# Patient Record
Sex: Male | Born: 1980 | Race: Black or African American | Hispanic: No | Marital: Married | Smoking: Never smoker
Health system: Southern US, Community
[De-identification: ages and names within clinical notes are randomized; demographics above are authoritative.]

## PROBLEM LIST (undated history)

## (undated) DIAGNOSIS — E109 Type 1 diabetes mellitus without complications: Secondary | ICD-10-CM

---

## 2010-08-02 ENCOUNTER — Encounter: Attending: Family Medicine | Admitting: Dietician

## 2010-08-02 DIAGNOSIS — Z713 Dietary counseling and surveillance: Secondary | ICD-10-CM | POA: Insufficient documentation

## 2010-08-02 DIAGNOSIS — E109 Type 1 diabetes mellitus without complications: Secondary | ICD-10-CM | POA: Insufficient documentation

## 2011-05-26 ENCOUNTER — Encounter: Payer: Self-pay | Admitting: Emergency Medicine

## 2011-05-26 ENCOUNTER — Inpatient Hospital Stay (HOSPITAL_COMMUNITY)
Admission: EM | Admit: 2011-05-26 | Discharge: 2011-05-30 | DRG: 639 | Disposition: A | Discharge status: Home / Self Care | Source: Ambulatory Visit | Attending: Internal Medicine | Admitting: Internal Medicine

## 2011-05-26 DIAGNOSIS — E109 Type 1 diabetes mellitus without complications: Secondary | ICD-10-CM

## 2011-05-26 DIAGNOSIS — E1010 Type 1 diabetes mellitus with ketoacidosis without coma: Principal | ICD-10-CM | POA: Diagnosis present

## 2011-05-26 DIAGNOSIS — Z23 Encounter for immunization: Secondary | ICD-10-CM

## 2011-05-26 DIAGNOSIS — R197 Diarrhea, unspecified: Secondary | ICD-10-CM | POA: Diagnosis present

## 2011-05-26 DIAGNOSIS — Z794 Long term (current) use of insulin: Secondary | ICD-10-CM

## 2011-05-26 DIAGNOSIS — E1110 Type 2 diabetes mellitus with ketoacidosis without coma: Secondary | ICD-10-CM

## 2011-05-26 DIAGNOSIS — E860 Dehydration: Secondary | ICD-10-CM | POA: Diagnosis present

## 2011-05-26 DIAGNOSIS — R112 Nausea with vomiting, unspecified: Secondary | ICD-10-CM | POA: Diagnosis present

## 2011-05-26 HISTORY — DX: Type 1 diabetes mellitus without complications: E10.9

## 2011-05-26 LAB — GLUCOSE, CAPILLARY: Glucose-Capillary: 573 mg/dL (ref 70–99)

## 2011-05-26 MED ORDER — SODIUM CHLORIDE 0.9 % IV BOLUS (SEPSIS)
1000.0000 mL | Freq: Once | INTRAVENOUS | Status: AC
  Administered 2011-05-26: 1000 mL via INTRAVENOUS

## 2011-05-26 MED ORDER — ONDANSETRON HCL 4 MG/2ML IJ SOLN
4.0000 mg | Freq: Once | INTRAMUSCULAR | Status: AC
  Administered 2011-05-26: 4 mg via INTRAVENOUS
  Filled 2011-05-26: qty 2

## 2011-05-26 NOTE — ED Notes (Signed)
Pt reports n/v/d onset this AM  3-5 emesis and 4-5 lose stools today pt reports Hx DM

## 2011-05-26 NOTE — ED Notes (Addendum)
Pt stood during orthostatics. He became diaphoretic and had to sit back down in the bed. Pt now resting. MD made aware.

## 2011-05-26 NOTE — ED Notes (Signed)
RT called for blood gas

## 2011-05-26 NOTE — ED Provider Notes (Signed)
History     CSN: [id number]  Arrival date & time 05/26/11  2142   First MD Initiated Contact with Patient 05/26/11 2307      Chief Complaint  Patient presents with  . Nausea  . Emesis  . Weakness  . Diarrhea  . Dehydration    (Consider location/radiation/quality/duration/timing/severity/associated sxs/prior treatment) HPI Comments: Type I diabetic presenting with one day of nausea, vomiting, diarrhea abdominal pain. Symptoms started this morning and consist of 5 episodes of nonbilious nonbloody emesis and 4 episodes of loose nonbloody stools. Denies any fevers. Z. no appetite today and his sugars have been elevated in the 290 range.  Denies Chest pain, shortness of breath, fever, chills, dysuria or hematuria.  Was out of insulin for two days last week but is now back on it. PCP Tamera Punt at [facility] urgent care.  The history is provided by the patient.    Past Medical History  Diagnosis Date  . Diabetes mellitus     History reviewed. No pertinent past surgical history.  History reviewed. No pertinent family history.  History  Substance Use Topics  . Smoking status: Never Smoker   . Smokeless tobacco: Not on file  . Alcohol Use: No      Review of Systems  Gastrointestinal: Positive for vomiting and diarrhea.  Neurological: Positive for weakness.  All other systems reviewed and are negative.    Allergies  Review of patient's allergies indicates no known allergies.  Home Medications   Current Outpatient Rx  Name Route Sig Dispense Refill  . INSULIN ASPART 100 UNIT/ML [place] SOLN Subcutaneous Inject 2-10 Units into the skin 3 (three) times daily before meals.      . INSULIN GLARGINE 100 UNIT/ML [place] SOLN Subcutaneous Inject 30 Units into the skin 2 (two) times daily.        BP 119/70  Pulse 117  Temp(Src) 98.5 F (36.9 C) (Oral)  Resp 20  SpO2 100%  Physical Exam  Constitutional: He is oriented to person, place, and time. He appears well-developed and  well-nourished. No distress.  HENT:  Head: Normocephalic and atraumatic.  Mouth/Throat: No oropharyngeal exudate.       Dry mucous membranes  Eyes: Conjunctivae and EOM are normal. Pupils are equal, round, and reactive to light.  Neck: Normal range of motion. Neck supple.  Cardiovascular: Normal rate, regular rhythm and normal heart sounds.   Pulmonary/Chest: Effort normal. No respiratory distress.  Abdominal: Soft. Bowel sounds are normal. There is no tenderness. There is no rebound and no guarding.  Musculoskeletal: Normal range of motion. He exhibits no edema and no tenderness.  Neurological: He is alert and oriented to person, place, and time. No cranial nerve deficit.  Skin: Skin is warm.    ED Course  Procedures (including critical care time)  Labs Reviewed  CBC - Abnormal; Notable for the following:    WBC 32.2 (*)    All other components within normal limits  DIFFERENTIAL - Abnormal; Notable for the following:    Neutrophils Relative 92 (*)    Neutro Abs 29.6 (*)    Lymphocytes Relative 4 (*)    Monocytes Absolute 1.3 (*)    All other components within normal limits  BASIC METABOLIC PANEL - Abnormal; Notable for the following:    Chloride 89 (*)    CO2 13 (*)    Glucose, Bld 507 (*)    GFR calc non Af Amer 87 (*)    All other components within normal limits  URINALYSIS, ROUTINE W REFLEX MICROSCOPIC - Abnormal; Notable for the following:    Glucose, UA >1000 (*)    Ketones, ur >80 (*)    All other components within normal limits  LACTIC ACID, PLASMA - Abnormal; Notable for the following:    Lactic Acid, Venous 5.9 (*)    All other components within normal limits  GLUCOSE, CAPILLARY - Abnormal; Notable for the following:    Glucose-Capillary 573 (*)    All other components within normal limits  POCT I-STAT 3, BLOOD GAS (G3P V) - Abnormal; Notable for the following:    pH, Ven 7.170 (*)    pCO2, Ven 37.6 (*)    Bicarbonate 13.7 (*)    Acid-base deficit 14.0 (*)      All other components within normal limits  GLUCOSE, CAPILLARY - Abnormal; Notable for the following:    Glucose-Capillary 475 (*)    All other components within normal limits  URINE MICROSCOPIC-ADD ON  BLOOD GAS, VENOUS  POCT CBG MONITORING  KETONES, QUALITATIVE   No results found.   1. DKA (diabetic ketoacidoses)       MDM  Nausea, diarrhea, abdominal pain and vomiting with elevated blood sugar. Rule out DKA. Patient Clinically dehydrated, will initiate IV hydration, antiemetics, obtain labs.  PH 7.1 with HCO3 13.  Concern for DKA on VBG.  Will start IV insulin glucostabilizer with IVF.  Anion gap 23, lactate 6, bicarbonate 13. DKA likely from noncompliance with insulin.  No infectious symptoms.  CRITICAL CARE Performed by: Glynn Octave   Total critical care time: 35  Critical care time was exclusive of separately billable procedures and treating other patients.  Critical care was necessary to treat or prevent imminent or life-threatening deterioration.  Critical care was time spent personally by me on the following activities: development of treatment plan with patient and/or surrogate as well as nursing, discussions with consultants, evaluation of patient's response to treatment, examination of patient, obtaining history from patient or surrogate, ordering and performing treatments and interventions, ordering and review of laboratory studies, ordering and review of radiographic studies, pulse oximetry and re-evaluation of patient's condition.       Glynn Octave, MD 05/27/11 [phone]

## 2011-05-27 ENCOUNTER — Encounter (HOSPITAL_COMMUNITY): Payer: Self-pay | Admitting: Internal Medicine

## 2011-05-27 ENCOUNTER — Emergency Department (HOSPITAL_COMMUNITY)

## 2011-05-27 DIAGNOSIS — E1010 Type 1 diabetes mellitus with ketoacidosis without coma: Secondary | ICD-10-CM | POA: Diagnosis present

## 2011-05-27 DIAGNOSIS — E109 Type 1 diabetes mellitus without complications: Secondary | ICD-10-CM | POA: Diagnosis present

## 2011-05-27 LAB — CBC
HCT: 46 % (ref 39.0–52.0)
Hemoglobin: 16.2 g/dL (ref 13.0–17.0)
Hemoglobin: 12.7 g/dL — ABNORMAL LOW (ref 13.0–17.0)
MCHC: 34.1 g/dL (ref 30.0–36.0)
MCV: 80.7 fL (ref 78.0–100.0)
Platelets: 158 10*3/uL (ref 150–400)
RBC: 5.7 MIL/uL (ref 4.22–5.81)
RBC: 4.58 MIL/uL (ref 4.22–5.81)
WBC: 32.2 10*3/uL — ABNORMAL HIGH (ref 4.0–10.5)

## 2011-05-27 LAB — BASIC METABOLIC PANEL
BUN: 20 mg/dL (ref 6–23)
BUN: 17 mg/dL (ref 6–23)
BUN: 15 mg/dL (ref 6–23)
CO2: 13 mEq/L — ABNORMAL LOW (ref 19–32)
Calcium: 7.8 mg/dL — ABNORMAL LOW (ref 8.4–10.5)
Calcium: 8.2 mg/dL — ABNORMAL LOW (ref 8.4–10.5)
Calcium: 8.8 mg/dL (ref 8.4–10.5)
Calcium: 8.5 mg/dL (ref 8.4–10.5)
Chloride: 89 mEq/L — ABNORMAL LOW (ref 96–112)
Creatinine, Ser: 1.11 mg/dL (ref 0.50–1.35)
Creatinine, Ser: 0.95 mg/dL (ref 0.50–1.35)
Creatinine, Ser: 0.92 mg/dL (ref 0.50–1.35)
GFR calc Af Amer: >90 mL/min (ref >90)
GFR calc Af Amer: >90 mL/min (ref >90)
GFR calc Af Amer: >90 mL/min (ref >90)
GFR calc non Af Amer: >90 mL/min (ref >90)
GFR calc non Af Amer: >90 mL/min (ref >90)
GFR calc non Af Amer: >90 mL/min (ref >90)
Glucose, Bld: 507 mg/dL — ABNORMAL HIGH (ref 70–99)
Potassium: 4.2 mEq/L (ref 3.5–5.1)
Sodium: 142 mEq/L (ref 135–145)
Sodium: 140 mEq/L (ref 135–145)

## 2011-05-27 LAB — GLUCOSE, CAPILLARY
Glucose-Capillary: 136 mg/dL — ABNORMAL HIGH (ref 70–99)
Glucose-Capillary: 141 mg/dL — ABNORMAL HIGH (ref 70–99)
Glucose-Capillary: 145 mg/dL — ABNORMAL HIGH (ref 70–99)
Glucose-Capillary: 260 mg/dL — ABNORMAL HIGH (ref 70–99)
Glucose-Capillary: 322 mg/dL — ABNORMAL HIGH (ref 70–99)
Glucose-Capillary: 362 mg/dL — ABNORMAL HIGH (ref 70–99)
Glucose-Capillary: 475 mg/dL — ABNORMAL HIGH (ref 70–99)
Glucose-Capillary: 77 mg/dL (ref 70–99)

## 2011-05-27 LAB — MRSA PCR SCREENING: MRSA by PCR: NEGATIVE

## 2011-05-27 LAB — DIFFERENTIAL
Basophils Absolute: 0 10*3/uL (ref 0.0–0.1)
Eosinophils Relative: 0 % (ref 0–5)
Lymphocytes Relative: 4 % — ABNORMAL LOW (ref 12–46)
Lymphs Abs: 1.3 10*3/uL (ref 0.7–4.0)
Monocytes Absolute: 1.3 10*3/uL — ABNORMAL HIGH (ref 0.1–1.0)
Monocytes Relative: 4 % (ref 3–12)
Neutro Abs: 29.6 10*3/uL — ABNORMAL HIGH (ref 1.7–7.7)

## 2011-05-27 LAB — HEMOGLOBIN A1C
Hgb A1c MFr Bld: 12.2 % — ABNORMAL HIGH (ref <5.7)
Mean Plasma Glucose: 303 mg/dL — ABNORMAL HIGH (ref <117)

## 2011-05-27 LAB — URINALYSIS, ROUTINE W REFLEX MICROSCOPIC
Glucose, UA: >1000 mg/dL — AB
Hgb urine dipstick: NEGATIVE
Ketones, ur: >80 mg/dL — AB
Leukocytes, UA: NEGATIVE
pH: 5 (ref 5.0–8.0)

## 2011-05-27 LAB — POCT I-STAT 3, VENOUS BLOOD GAS (G3P V)
Acid-base deficit: 14 mmol/L — ABNORMAL HIGH (ref 0.0–2.0)
Bicarbonate: 13.7 mEq/L — ABNORMAL LOW (ref 20.0–24.0)
O2 Saturation: 56 %
pO2, Ven: 37 mmHg (ref 30.0–45.0)

## 2011-05-27 LAB — URINE MICROSCOPIC-ADD ON

## 2011-05-27 MED ORDER — SODIUM CHLORIDE 0.45 % IV SOLN
INTRAVENOUS | Status: AC
  Administered 2011-05-27: 14:00:00 via INTRAVENOUS

## 2011-05-27 MED ORDER — SODIUM CHLORIDE 0.9 % IV SOLN: INTRAVENOUS | Status: AC

## 2011-05-27 MED ORDER — SODIUM CHLORIDE 0.9 % IV SOLN
999.0000 mL | Freq: Once | INTRAVENOUS | Status: AC
  Administered 2011-05-27: 999 mL via INTRAVENOUS

## 2011-05-27 MED ORDER — POTASSIUM CHLORIDE 10 MEQ/100ML IV SOLN: 10.0000 meq | INTRAVENOUS | Status: AC

## 2011-05-27 MED ORDER — INSULIN GLARGINE 100 UNIT/ML ~~LOC~~ SOLN
25.0000 [IU] | Freq: Two times a day (BID) | SUBCUTANEOUS | Status: DC
  Administered 2011-05-27 – 2011-05-28 (×3): 25 [IU] via SUBCUTANEOUS
  Filled 2011-05-27: qty 3

## 2011-05-27 MED ORDER — SODIUM CHLORIDE 0.45 % IV SOLN
INTRAVENOUS | Status: DC
  Administered 2011-05-27: 03:00:00 via INTRAVENOUS

## 2011-05-27 MED ORDER — PNEUMOCOCCAL VAC POLYVALENT 25 MCG/0.5ML IJ INJ
0.5000 mL | INJECTION | INTRAMUSCULAR | Status: AC
  Administered 2011-05-28: 0.5 mL via INTRAMUSCULAR
  Filled 2011-05-27: qty 0.5

## 2011-05-27 MED ORDER — INSULIN REGULAR HUMAN 100 UNIT/ML IJ SOLN
INTRAMUSCULAR | Status: AC
  Administered 2011-05-27: 4.2 [IU]/h via INTRAVENOUS
  Filled 2011-05-27: qty 1

## 2011-05-27 MED ORDER — ONDANSETRON HCL 4 MG/2ML IJ SOLN: 4.0000 mg | Freq: Three times a day (TID) | INTRAMUSCULAR | Status: DC | PRN

## 2011-05-27 MED ORDER — DEXTROSE 50 % IV SOLN: 25.0000 mL | INTRAVENOUS | Status: DC | PRN

## 2011-05-27 MED ORDER — HEPARIN SODIUM (PORCINE) 5000 UNIT/ML IJ SOLN
5000.0000 [IU] | Freq: Three times a day (TID) | INTRAMUSCULAR | Status: DC
  Administered 2011-05-27 – 2011-05-29 (×7): 5000 [IU] via SUBCUTANEOUS
  Filled 2011-05-27 (×13): qty 1

## 2011-05-27 MED ORDER — INFLUENZA VIRUS VACC SPLIT PF IM SUSP
0.5000 mL | INTRAMUSCULAR | Status: AC
  Administered 2011-05-27: 0.5 mL via INTRAMUSCULAR
  Filled 2011-05-27: qty 0.5

## 2011-05-27 MED ORDER — DEXTROSE-NACL 5-0.45 % IV SOLN
INTRAVENOUS | Status: AC
  Administered 2011-05-27: 06:00:00 via INTRAVENOUS

## 2011-05-27 MED ORDER — SODIUM CHLORIDE 0.9 % IV SOLN
INTRAVENOUS | Status: AC
  Administered 2011-05-27: 02:00:00 via INTRAVENOUS

## 2011-05-27 MED ORDER — INSULIN ASPART 100 UNIT/ML ~~LOC~~ SOLN
0.0000 [IU] | Freq: Three times a day (TID) | SUBCUTANEOUS | Status: DC
  Administered 2011-05-27: 3 [IU] via SUBCUTANEOUS
  Administered 2011-05-28: 5 [IU] via SUBCUTANEOUS
  Filled 2011-05-27: qty 3

## 2011-05-27 NOTE — H&P (Signed)
PCP:   No primary provider on file.   Pt states Richard Bray at [facility] but unable to enter this into EPIC  Chief Complaint:  Abd pain, nausea, vomiting, diarrhea   HPI: 30yoM with h/o DM type 1 and prior DKA presents with DKA.   Pt states he has been out of insulin for the past couple days. Pt was feeling well, in usual  state of health until yesterday, when he developed abdominal pain, that persisted until the am of  1/7. He went to see his PCP's office to refill his insulin, where they started him on either  Glyburide or Glipizide (wife cannot remember) and stopped Metformin due to some abdominal pain  and got an A1c which was 12. He went home and was not feeling well, tried to eat something but  started vomiting, then having diarrhea. He came to the ED.   In the ED pt was tachycardic to 117, other vitals stable. Labs showed Na 135, K 5.0, Cl 89, HCO3  of 13, renal 20/1.11, glucose 507. VBG showed 7.17 / CO2 37 / O2 37 / HCO3 14. Lactate was 5.9.  WBC 32.2 with 92% neutros, o/w CBC normal. UA with >1000 glucose, >80 ketones, o/w negative. CXR  negative.   Pt is able to relate his insulin regimen of Lantus 30u BID and Novolog before meals, usually less  than 10u with meals. He states he was diagnosed at 31yo with type 1 DM in [place], where he was  admitted to an ICU with blood sugar of 1100.   ROS as above, o/w with 12 lb wt loss in 3 mos, polyuria and thirst, n/v/abd pain/diarrhea. He  denies fevers, chills, sweats, dysuria, cough, chest pain, SOB. ROS o/w negative.   Past Medical History  Diagnosis Date  . Type 1 diabetes mellitus     Diagnosed at 31 yo with DKA / ICU admission    History reviewed. No pertinent past surgical history.  Medications:  HOME MEDS:  Reconciled  Prior to Admission medications   Medication Sig Start Date End Date Taking? Authorizing Provider  insulin aspart (NOVOLOG) 100 UNIT/ML injection Inject 2-10 Units into the skin 3  (three) times daily before meals.     Yes Historical Provider, MD  insulin glargine (LANTUS) 100 UNIT/ML injection Inject 30 Units into the skin 2 (two) times daily.     Yes Historical Provider, MD  lisinopril (PRINIVIL,ZESTRIL) 20 MG tablet Take 20 mg by mouth daily.     Yes Historical Provider, MD   Allergies:  No Known Allergies  Social History:   reports that he has never smoked. He has never used smokeless tobacco. He reports that he uses illicit drugs (Marijuana). He reports that he does not drink alcohol.  Still active, gets around without cane or walker. Lives at home with wife, no children. Unemployed. Never smoker, no alcohol.   Family History: Family History  Problem Relation Age of Onset  . Sickle cell anemia Father   . Aneurysm Mother     Cerebral    Physical Exam: Filed Vitals:   05/26/11 2345 05/26/11 2346 05/27/11 0000 05/27/11 0104  BP: 126/74 119/70 126/70 128/59  Pulse: 106 117 103 109  Temp:      TempSrc:      Resp:   23 19  SpO2: 100% 100% 100% 100%   Blood pressure 128/59, pulse 109, temperature 98.5 F (36.9 C), temperature source Oral, resp. rate 19, SpO2 100.00%. Gen: Thin, overall  healthy but currently fatigued appearing M in stretcher, able to converse and  relate his history, doesn't appear toxic but minimally ill HEENT: PERRL, EOMI, sclera clear and normal. Lips are quite dry appearing, but mouth is fairly  moist without lesions Lungs: CTAB no w/c/r, overall normal exam Heart: Tachycardic without gross m/g, bilateral radials palpable Abd: Soft, NT ND, not rigid, not obese, overall normal Extrem: Thin, but with normal bulk and tone, no BLE edema, feet are minimally cool but not cold Neuro: Alert, conversant, CN 2-12 intact, no slurred speech, no facial droop, moving extremities  well, grossly non-focal   Labs & Imaging Results for orders placed during the hospital encounter of 05/26/11 (from the past 48 hour(s))  CBC     Status: Abnormal    Collection Time   05/26/11 11:24 PM      Component Value Range Comment   WBC 32.2 (*) 4.0 - 10.5 (K/uL)    RBC 5.70  4.22 - 5.81 (MIL/uL)    Hemoglobin 16.2  13.0 - 17.0 (g/dL)    HCT 16.1  09.6 - 04.5 (%)    MCV 80.7  78.0 - 100.0 (fL)    MCH 28.4  26.0 - 34.0 (pg)    MCHC 35.2  30.0 - 36.0 (g/dL)    RDW 40.9  81.1 - 91.4 (%)    Platelets 224  150 - 400 (K/uL)   DIFFERENTIAL     Status: Abnormal   Collection Time   05/26/11 11:24 PM      Component Value Range Comment   Neutrophils Relative 92 (*) 43 - 77 (%)    Neutro Abs 29.6 (*) 1.7 - 7.7 (K/uL)    Lymphocytes Relative 4 (*) 12 - 46 (%)    Lymphs Abs 1.3  0.7 - 4.0 (K/uL)    Monocytes Relative 4  3 - 12 (%)    Monocytes Absolute 1.3 (*) 0.1 - 1.0 (K/uL)    Eosinophils Relative 0  0 - 5 (%)    Eosinophils Absolute 0.0  0.0 - 0.7 (K/uL)    Basophils Relative 0  0 - 1 (%)    Basophils Absolute 0.0  0.0 - 0.1 (K/uL)   BASIC METABOLIC PANEL     Status: Abnormal   Collection Time   05/26/11 11:24 PM      Component Value Range Comment   Sodium 135  135 - 145 (mEq/L)    Potassium 5.0  3.5 - 5.1 (mEq/L)    Chloride 89 (*) 96 - 112 (mEq/L)    CO2 13 (*) 19 - 32 (mEq/L)    Glucose, Bld 507 (*) 70 - 99 (mg/dL)    BUN 20  6 - 23 (mg/dL)    Creatinine, Ser 7.82  0.50 - 1.35 (mg/dL)    Calcium 95.6  8.4 - 10.5 (mg/dL)    GFR calc non Af Amer 87 (*) >90 (mL/min)    GFR calc Af Amer >90  >90 (mL/min)   KETONES, QUALITATIVE     Status: Abnormal   Collection Time   05/26/11 11:24 PM      Component Value Range Comment   Acetone, Bld SMALL (*) NEGATIVE    LACTIC ACID, PLASMA     Status: Abnormal   Collection Time   05/26/11 11:24 PM      Component Value Range Comment   Lactic Acid, Venous 5.9 (*) 0.5 - 2.2 (mmol/L)   GLUCOSE, CAPILLARY     Status: Abnormal   Collection Time  05/26/11 11:31 PM      Component Value Range Comment   Glucose-Capillary 573 (*) 70 - 99 (mg/dL)    Comment 1 Notify RN      Comment 2 Documented in Chart       URINALYSIS, ROUTINE W REFLEX MICROSCOPIC     Status: Abnormal   Collection Time   05/26/11 11:36 PM      Component Value Range Comment   Color, Urine YELLOW  YELLOW     APPearance CLEAR  CLEAR     Specific Gravity, Urine 1.020  1.005 - 1.030     pH 5.0  5.0 - 8.0     Glucose, UA >1000 (*) NEGATIVE (mg/dL)    Hgb urine dipstick NEGATIVE  NEGATIVE     Bilirubin Urine NEGATIVE  NEGATIVE     Ketones, ur >80 (*) NEGATIVE (mg/dL)    Protein, ur NEGATIVE  NEGATIVE (mg/dL)    Urobilinogen, UA 0.2  0.0 - 1.0 (mg/dL)    Nitrite NEGATIVE  NEGATIVE     Leukocytes, UA NEGATIVE  NEGATIVE    URINE MICROSCOPIC-ADD ON     Status: Normal   Collection Time   05/26/11 11:36 PM      Component Value Range Comment   Squamous Epithelial / LPF RARE  RARE     WBC, UA 0-2  <3 (WBC/hpf)    RBC / HPF 0-2  <3 (RBC/hpf)   POCT I-STAT 3, BLOOD GAS (G3P V)     Status: Abnormal   Collection Time   05/27/11 12:03 AM      Component Value Range Comment   pH, Ven 7.170 (*) 7.250 - 7.300     pCO2, Ven 37.6 (*) 45.0 - 50.0 (mmHg)    pO2, Ven 37.0  30.0 - 45.0 (mmHg)    Bicarbonate 13.7 (*) 20.0 - 24.0 (mEq/L)    TCO2 15  0 - 100 (mmol/L)    O2 Saturation 56.0      Acid-base deficit 14.0 (*) 0.0 - 2.0 (mmol/L)    Sample type VENOUS      Comment NOTIFIED PHYSICIAN     GLUCOSE, CAPILLARY     Status: Abnormal   Collection Time   05/27/11 12:20 AM      Component Value Range Comment   Glucose-Capillary 475 (*) 70 - 99 (mg/dL)    Comment 1 Notify RN      Comment 2 Documented in Chart     GLUCOSE, CAPILLARY     Status: Abnormal   Collection Time   05/27/11  2:01 AM      Component Value Range Comment   Glucose-Capillary 362 (*) 70 - 99 (mg/dL)    Comment 1 Notify RN      Dg Chest 2 View  05/27/2011  *RADIOLOGY REPORT*  Clinical Data: Vomiting  CHEST - 2 VIEW  Comparison: None  Findings: Normal mediastinum and heart silhouette.  There is scoliosis of the spine.  No effusion, infiltrate, pneumothorax.  IMPRESSION: No acute  cardiopulmonary process.  Scoliosis.  Original Report Authenticated By: Genevive Bi, M.D.    Impression Present on Admission:  .DKA, type 1, not at goal .Type 1 diabetes mellitus  30yoM with h/o DM type 1 and prior DKA presents with DKA.   DKA: Likely due to missing insulin for the past 1-2 days due to running out of insulin. Despite  high WBC count, no real signs of infection and ROS o/w unremarkable other than uncontrolled DM  type symptoms. States his A1c  done at PCP's was 12, so will defer repeating.   - IVF's, glucose stabilizer, admit to SDU, trending BMET's - Holding Lisinopril for now  - DM education consultation -- pt and wife with many questions and eager to learn   2. Leukocytosis: CXR and UA are negative for infxn. Suspect overall likely due to stress rxn from  DKA. Monitor for now.   SDU, MC team 7 Full code, discussed with pt and wife   Other plans as per orders.  Lorriann Hansmann 05/27/2011, 2:14 AM

## 2011-05-27 NOTE — Progress Notes (Addendum)
Inpatient Diabetes Program Recommendations  AACE/ADA: New Consensus Statement on Inpatient Glycemic Control (2009)  Target Ranges:  Prepandial:   less than 140 mg/dL      Peak postprandial:   less than 180 mg/dL (1-2 hours)      Critically ill patients:  140 - 180 mg/dL   Reason for Visit: Patient admitted with DKA after he ran out of insulin for 1 day.  He see's MD at urgent care for diabetes management.  States Lantus was just increased yesterday due to high A1C from 20 units bid to 30 units bid.  Seems very interested in improving glycemic control and preventing complications.  Briefly discussed physiology of Type 1 diabetes and DKA.  He is interested in seeing endocrinologist for more consistent follow-up.   Inpatient Diabetes Program Recommendations Insulin - Basal: Consider Lantus 30 units once daily when patient transitioned off insulin drip. Insulin - Meal Coverage: Would also benefit from Novolog meal coverage 6 units tid with meals. (to cover CHO intake) HgbA1C: Note last A1C=12.0 indicating very poorly controlled diabetes. Outpatient Referral: Would greatly benefit from outpatient diabetes education.  Note: Patient is interested in learning to [facility] count and cover carbohydrates.  Will follow. Patient states that he does have insurance coverage for his meds.  He currently uses Lantus vials and Novolog pen at home.  He needs follow up with PCP or endocrinologist if possible.

## 2011-05-27 NOTE — ED Notes (Signed)
RN unable to take report at this time. Will call back in 5 minutes.

## 2011-05-27 NOTE — ED Notes (Signed)
MD at bedside. 

## 2011-05-27 NOTE — ED Notes (Signed)
Patient transported to X-ray 

## 2011-05-27 NOTE — Progress Notes (Signed)
Richard Bray CSN:[id number],MRN:[id number] is a 31 y.o. male,  Outpatient Primary MD for the patient is No primary provider on file.  Chief Complaint  Patient presents with  . Nausea  . Emesis  . Weakness  . Diarrhea  . Dehydration        Subjective:   Richard Bray today has, No headache, No chest pain, No abdominal pain - No Nausea, No new weakness tingling or numbness, No Cough - SOB.   Objective:   Filed Vitals:   05/27/11 0220 05/27/11 0300 05/27/11 0800 05/27/11 1200  BP: 122/52 133/61 115/59 95/63  Pulse: 111 113 102   Temp:  99.1 F (37.3 C) 98.9 F (37.2 C) 99.1 F (37.3 C)  TempSrc:  Oral Oral Oral  Resp: 18 17 14 14   Height:  6' (1.829 m)    Weight:  68.6 kg (151 lb 3.8 oz)    SpO2: 100% 100% 100% 100%    Wt Readings from Last 3 Encounters:  05/27/11 68.6 kg (151 lb 3.8 oz)     Intake/Output Summary (Last 24 hours) at 05/27/11 1251 Last data filed at 05/27/11 1200  Gross per 24 hour  Intake    250 ml  Output   1350 ml  Net  -1100 ml    Exam Awake Alert, Oriented *3, No new F.N deficits, Normal affect [place].AT,PERRAL Supple Neck,No JVD, No cervical lymphadenopathy appriciated.  Symmetrical Chest wall movement, Good air movement bilaterally, CTAB RRR,No Gallops,Rubs or new Murmurs, No Parasternal Heave +ve B.Sounds, Abd Soft, Non tender, No organomegaly appriciated, No rebound -guarding or rigidity. No Cyanosis, Clubbing or edema, No new Rash or bruise     Data Review  CBC  Lab 05/27/11 0400 05/26/11 2324  WBC 23.0* 32.2*  HGB 12.7* 16.2  HCT 37.2* 46.0  PLT 158 224  MCV 81.2 80.7  MCH 27.7 28.4  MCHC 34.1 35.2  RDW 12.4 12.4  LYMPHSABS -- 1.3  MONOABS -- 1.3*  EOSABS -- 0.0  BASOSABS -- 0.0  BANDABS -- --    Chemistries   Lab 05/27/11 1035 05/27/11 0833 05/27/11 0600 05/27/11 0400 05/26/11 2324  NA 140 142 140 137 135  K 4.2 4.5 4.2 4.7 5.0  CL 109 110 109 105 89*  CO2 22 22 15* 13* 13*  GLUCOSE 104* 126* 189* 291* 507*  BUN 15 15  16 17 20   CREATININE 0.89 0.95 0.92 0.95 1.11  CALCIUM 8.5 8.8 8.2* 7.8* 10.4  MG -- -- -- -- --  AST -- -- -- -- --  ALT -- -- -- -- --  ALKPHOS -- -- -- -- --  BILITOT -- -- -- -- --   ------------------------------------------------------------------------------------------------------------------ estimated creatinine clearance is 116.7 ml/min (by C-G formula based on Cr of 0.89). ------------------------------------------------------------------------------------------------------------------ No results found for this basename: HGBA1C:2 in the last 72 hours ------------------------------------------------------------------------------------------------------------------ No results found for this basename: CHOL:2,HDL:2,LDLCALC:2,TRIG:2,CHOLHDL:2,LDLDIRECT:2 in the last 72 hours ------------------------------------------------------------------------------------------------------------------ No results found for this basename: TSH,T4TOTAL,FREET3,T3FREE,THYROIDAB in the last 72 hours ------------------------------------------------------------------------------------------------------------------ No results found for this basename: VITAMINB12:2,FOLATE:2,FERRITIN:2,TIBC:2,IRON:2,RETICCTPCT:2 in the last 72 hours  Coagulation profile No results found for this basename: INR:5,PROTIME:5 in the last 168 hours  No results found for this basename: DDIMER:2 in the last 72 hours  Cardiac Enzymes No results found for this basename: CK:3,CKMB:3,TROPONINI:3,MYOGLOBIN:3 in the last 168 hours ------------------------------------------------------------------------------------------------------------------ No components found with this basename: POCBNP:3  Micro Results Recent Results (from the past 240 hour(s))  MRSA PCR SCREENING     Status: Normal   Collection Time  05/27/11  3:25 AM      Component Value Range Status Comment   MRSA by PCR NEGATIVE  NEGATIVE  Final     Radiology Reports Dg  Chest 2 View  05/27/2011  *RADIOLOGY REPORT*  Clinical Data: Vomiting  CHEST - 2 VIEW  Comparison: None  Findings: Normal mediastinum and heart silhouette.  There is scoliosis of the spine.  No effusion, infiltrate, pneumothorax.  IMPRESSION: No acute cardiopulmonary process.  Scoliosis.  Original Report Authenticated By: Genevive Bi, M.D.    Scheduled Meds:   . sodium chloride  999 mL Intravenous Once  . sodium chloride   Intravenous STAT  . heparin  5,000 Units Subcutaneous Q8H  . influenza  inactive virus vaccine  0.5 mL Intramuscular Tomorrow-1000  . insulin glargine  25 Units Subcutaneous BID  . insulin (NOVOLIN-R) infusion   Intravenous To Major  . ondansetron (ZOFRAN) IV  4 mg Intravenous Once  . pneumococcal 23 valent vaccine  0.5 mL Intramuscular Tomorrow-1000  . potassium chloride  10 mEq Intravenous Q1H  . sodium chloride  1,000 mL Intravenous Once   Continuous Infusions:   . sodium chloride    . dextrose 5 % and 0.45% NaCl 75 mL/hr at 05/27/11 0900  . insulin (NOVOLIN-R) infusion    . DISCONTD: sodium chloride 125 mL/hr at 05/27/11 0319   PRN Meds:.dextrose, DISCONTD: ondansetron (ZOFRAN) IV  Assessment & Plan   1. DKA, type 1,- in a Type 1 diabetes mellitus pt - who ran out of his Meds, gap closed, initiate Lantus, off drips in 6 hrs, PO duet now, ISS scale start in 6 hrs, gentle NS bolus as BP low, A1c, Case manager for Med help.  2.Leukocytosis- CXR and UA stable, no temp, likely reactionary.   DVT Prophylaxis Heparin   See all Orders from today for further details     Leroy Sea M.D on 05/27/2011 at 12:51 PM  [facility] Office  [phone]

## 2011-05-27 NOTE — Plan of Care (Signed)
Problem: Consults Goal: Diabetic Ketoacidosis (DKA) Patient Education See Patient Education Modules for education specifics. Outcome: Progressing Pt was out of insulin for 36 hours Goal: Nutrition Consult-if indicated Outcome: Not Progressing Pt npo until DKA resolves

## 2011-05-27 NOTE — Progress Notes (Signed)
Spoke at length to patient and wife regarding diabetes.  Patient has had Type 1 diabetes for 10 years.  Seems motivated to learn.  Gave information re. Carbohydrate counting, etc.  Answered questions re. Sick day rules.  Will follow.

## 2011-05-28 LAB — GLUCOSE, CAPILLARY
Glucose-Capillary: 59 mg/dL — ABNORMAL LOW (ref 70–99)
Glucose-Capillary: 102 mg/dL — ABNORMAL HIGH (ref 70–99)
Glucose-Capillary: 219 mg/dL — ABNORMAL HIGH (ref 70–99)

## 2011-05-28 MED ORDER — INSULIN GLARGINE 100 UNIT/ML ~~LOC~~ SOLN
17.0000 [IU] | Freq: Two times a day (BID) | SUBCUTANEOUS | Status: DC
  Administered 2011-05-29 – 2011-05-30 (×3): 17 [IU] via SUBCUTANEOUS
  Filled 2011-05-28: qty 3

## 2011-05-28 MED ORDER — INSULIN ASPART 100 UNIT/ML ~~LOC~~ SOLN
0.0000 [IU] | Freq: Every day | SUBCUTANEOUS | Status: DC
Start: 2011-05-28 — End: 2011-05-30

## 2011-05-28 MED ORDER — OXYCODONE HCL 5 MG PO TABS
5.0000 mg | ORAL_TABLET | ORAL | Status: DC | PRN
  Administered 2011-05-29 – 2011-05-30 (×2): 10 mg via ORAL
  Filled 2011-05-28 (×2): qty 2

## 2011-05-28 MED ORDER — INSULIN ASPART 100 UNIT/ML ~~LOC~~ SOLN
6.0000 [IU] | Freq: Three times a day (TID) | SUBCUTANEOUS | Status: DC
  Administered 2011-05-29: 6 [IU] via SUBCUTANEOUS

## 2011-05-28 MED ORDER — ONDANSETRON HCL 4 MG/2ML IJ SOLN
4.0000 mg | Freq: Four times a day (QID) | INTRAMUSCULAR | Status: DC | PRN
  Administered 2011-05-28 – 2011-05-29 (×2): 4 mg via INTRAVENOUS
  Filled 2011-05-28 (×2): qty 2

## 2011-05-28 MED ORDER — INSULIN ASPART 100 UNIT/ML ~~LOC~~ SOLN
0.0000 [IU] | Freq: Three times a day (TID) | SUBCUTANEOUS | Status: DC
  Administered 2011-05-29: 3 [IU] via SUBCUTANEOUS

## 2011-05-28 MED ORDER — ACETAMINOPHEN 325 MG PO TABS
650.0000 mg | ORAL_TABLET | Freq: Four times a day (QID) | ORAL | Status: DC | PRN
  Administered 2011-05-28 – 2011-05-29 (×2): 650 mg via ORAL
  Filled 2011-05-28 (×2): qty 2

## 2011-05-28 MED ORDER — INSULIN GLARGINE 100 UNIT/ML ~~LOC~~ SOLN
20.0000 [IU] | Freq: Two times a day (BID) | SUBCUTANEOUS | Status: DC
  Administered 2011-05-28: 17 [IU] via SUBCUTANEOUS

## 2011-05-28 NOTE — Progress Notes (Signed)
05/28/2011 0815 Patients blood sugar was 59. Gave 120cc of OJ. Rechecked blood sugar at 0830 and bs=102. Pt had no sign or symptoms of low blood sugar. Is eating breakfast now. Will continue to monitor. Celesta Gentile

## 2011-05-28 NOTE — Progress Notes (Signed)
05/28/2011 6:01 PM Called report to marissa,rn on 3000. Vss. No complaints of pain. Pt transferred to [id number] with belonging. Celesta Gentile

## 2011-05-28 NOTE — Progress Notes (Signed)
   CARE MANAGEMENT NOTE 05/28/2011  Patient:  Richard Bray, Richard Bray   Account Number:  [id number]  Date Initiated:  05/28/2011  Documentation initiated by:  Onnie Boer  Subjective/Objective Assessment:   PT WAS ADMITTED WITH DKA     Action/Plan:   PROGRESSION OF CARE AND DISCHARGE PLANNING   Anticipated DC Date:  05/31/2011   Anticipated DC Plan:  HOME/SELF CARE      DC Planning Services  CM consult      Choice offered to / List presented to:             Status of service:  In process, will continue to follow Medicare Important Message given?   (If response is "NO", the following Medicare IM given date fields will be blank) Date Medicare IM given:   Date Additional Medicare IM given:    Discharge Disposition:    Per UR Regulation:  Reviewed for med. necessity/level of care/duration of stay  Comments:  05/28/11 Onnie Boer, RN, BSN 1415 UR COMPLETED PT WAS ADMITTED FROM HOME WITH SELF CARE AFTER DZ OF DKA. PT STATES THAT HE RAN OUT OF HIS MEDS AND ONCE HE GOT THEM FILLED HE WAS IN DKA.   NO DC NEEDS AT THIS TIME.

## 2011-05-28 NOTE — Progress Notes (Signed)
05/28/2011 1700 Patients blood sugar=62. Gave 120cc OJ, rechecked sugar=85. Will continue to monitor. Celesta Gentile

## 2011-05-28 NOTE — Progress Notes (Signed)
Inpatient Diabetes Program Recommendations  AACE/ADA: New Consensus Statement on Inpatient Glycemic Control (2009)  Target Ranges:  Prepandial:   less than 140 mg/dL      Peak postprandial:   less than 180 mg/dL (1-2 hours)      Critically ill patients:  140 - 180 mg/dL   Reason for Visit: Results for LUNDY, COZART (MRN [id number]) as of 05/28/2011 10:25  Ref. Range 05/27/2011 21:38 05/28/2011 08:12 05/28/2011 08:36  Glucose-Capillary Latest Range: 70-99 mg/dL 409 (H) 59 (L) 811 (H)    Inpatient Diabetes Program Recommendations Insulin - Basal: Note low CBG this morning.  Based on weight (68.6 kg), patient needs decreased amount of basal insulin. Please reduce Lantus to 17 units bid. Insulin - Meal Coverage: Patient will need Novolog meal coverage.  Consider Novolog 6 units tid with meals. HgbA1C: Discussed with patient in length yesterday the importance of glycemic control.  He seems motivated and is interested in seeing an endocrinologist post-hospitalization. Outpatient Referral: Would greatly benefit from outpatient diabetes education.  Note: Will follow.

## 2011-05-28 NOTE — Progress Notes (Signed)
FOLLOWED UP WITH PT ABOUT CONSULT FOR MED ASSIST.  PT STATED THAT HE HAD NO PROBLEMS WITH GETTING MEDS, HE WANTED TO SPEAK WITH THE ENDOCRINOLOGIST.  WILL PASS ALONG THE MESSAGE. Willa Rough 05/28/2011 [phone] OR [phone]

## 2011-05-28 NOTE — Progress Notes (Signed)
Subjective: 30yoM with h/o DM type 1 and prior DKA presents with DKA.  The patient is resting comfortably the present time.  He denies fevers chills nausea vomiting shortness of breath.  He is well educated about his diabetes care and appears to be well motivated to achieve better control.  Objective: Weight change:   Intake/Output Summary (Last 24 hours) at 05/28/11 1421 Last data filed at 05/28/11 1200  Gross per 24 hour  Intake    360 ml  Output   1550 ml  Net  -1190 ml   Blood pressure 118/64, pulse 76, temperature 98.2 F (36.8 C), temperature source Oral, resp. rate 11, height 6' (1.829 m), weight 68.6 kg (151 lb 3.8 oz), SpO2 100.00%.  Physical Exam: General: No acute respiratory distress Lungs: Clear to auscultation bilaterally without wheezes or crackles Cardiovascular: Regular rate and rhythm without murmur gallop or rub normal S1 and S2 Abdomen: Nontender, nondistended, soft, bowel sounds positive, no rebound, no ascites, no appreciable mass Extremities: No significant cyanosis, clubbing, or edema bilateral lower extremities  Lab Results:  Basename 05/27/11 1035 05/27/11 0833 05/27/11 0600  NA 140 142 140  K 4.2 4.5 4.2  CL 109 110 109  CO2 22 22 15*  GLUCOSE 104* 126* 189*  BUN 15 15 16   CREATININE 0.89 0.95 0.92  CALCIUM 8.5 8.8 8.2*  MG -- -- --  PHOS -- -- --   Basename 05/27/11 0400 05/26/11 2324  WBC 23.0* 32.2*  NEUTROABS -- 29.6*  HGB 12.7* 16.2  HCT 37.2* 46.0  MCV 81.2 80.7  PLT 158 224    Basename 05/27/11 1438  HGBA1C 12.2*    Micro Results: Recent Results (from the past 240 hour(s))  MRSA PCR SCREENING     Status: Normal   Collection Time   05/27/11  3:25 AM      Component Value Range Status Comment   MRSA by PCR NEGATIVE  NEGATIVE  Final     Studies/Results: All recent x-ray/radiology reports have been reviewed in detail.   Medications: I have reviewed the patient's complete medication list.  Assessment/Plan:  DKA Due to  noncompliance with insulin therapy-have educated patient on extreme danger of recurrent DKA-education continues  Uncontrolled diabetes mellitus type 1 CBGs are still quite erratic with a low of 59 and a high of 260-will resume ACE inhibitor prior to discharge if patient's blood pressures will allow-continue to adjust insulin regimen-we need to be sure that hypoglycemia does not recur and that CBGs are better controlled prior to discharge home  Leukocytosis Recheck in a.m.-no evidence of acute infection  Lonia Blood, MD [facility] Office  [phone] Pager [phone]  On-Call/Text Page:      [email or web address]      password [facility]

## 2011-05-28 NOTE — Progress Notes (Signed)
Spoke briefly to patient and wife again today.  Gave them my card just in case there are questions.  Discussed with Dr. Sharon Seller patients request to see endocrinologist.  May consider decreasing Novolog correction to sensitive since meal coverage was added.

## 2011-05-29 LAB — BASIC METABOLIC PANEL
BUN: 7 mg/dL (ref 6–23)
Chloride: 102 mEq/L (ref 96–112)
Creatinine, Ser: 0.74 mg/dL (ref 0.50–1.35)
GFR calc Af Amer: >90 mL/min (ref >90)
GFR calc non Af Amer: >90 mL/min (ref >90)

## 2011-05-29 LAB — CBC
HCT: 40.3 % (ref 39.0–52.0)
MCHC: 34.5 g/dL (ref 30.0–36.0)
RDW: 12.5 % (ref 11.5–15.5)

## 2011-05-29 LAB — GLUCOSE, CAPILLARY
Glucose-Capillary: 61 mg/dL — ABNORMAL LOW (ref 70–99)
Glucose-Capillary: 73 mg/dL (ref 70–99)

## 2011-05-29 MED ORDER — INSULIN ASPART 100 UNIT/ML ~~LOC~~ SOLN
4.0000 [IU] | Freq: Three times a day (TID) | SUBCUTANEOUS | Status: DC
  Administered 2011-05-29 – 2011-05-30 (×2): 4 [IU] via SUBCUTANEOUS
  Filled 2011-05-29 (×2): qty 3

## 2011-05-29 MED ORDER — INSULIN ASPART 100 UNIT/ML ~~LOC~~ SOLN
0.0000 [IU] | Freq: Every day | SUBCUTANEOUS | Status: DC
  Filled 2011-05-29: qty 3

## 2011-05-29 MED ORDER — LISINOPRIL 20 MG PO TABS
20.0000 mg | ORAL_TABLET | Freq: Every day | ORAL | Status: DC
  Administered 2011-05-29: 20 mg via ORAL
  Filled 2011-05-29 (×2): qty 1

## 2011-05-29 MED ORDER — INSULIN ASPART 100 UNIT/ML ~~LOC~~ SOLN
0.0000 [IU] | Freq: Three times a day (TID) | SUBCUTANEOUS | Status: DC
Start: 2011-05-29 — End: 2011-05-30
  Administered 2011-05-30: 2 [IU] via SUBCUTANEOUS

## 2011-05-29 NOTE — Progress Notes (Signed)
Inpatient Diabetes Program Recommendations  AACE/ADA: New Consensus Statement on Inpatient Glycemic Control (2009)  Target Ranges:  Prepandial:   less than 140 mg/dL      Peak postprandial:   less than 180 mg/dL (1-2 hours)      Critically ill patients:  140 - 180 mg/dL   Reason for Visit: Results for JAVARIAN, JAKUBIAK (MRN [id number]) as of 05/29/2011 09:12  Ref. Range 05/28/2011 11:57 05/28/2011 16:54 05/28/2011 17:12 05/28/2011 21:43 05/29/2011 06:33  Glucose-Capillary Latest Range: 70-99 mg/dL 409 (H) 62 (L) 85 811 (H)   Glucose Latest Range: 70-99 mg/dL     69 (L)    Inpatient Diabetes Program Recommendations Insulin - Basal: Note CBG was 69 mg/dL this am.  Patient received a total of 42 units of Lantus in the past 24 hours.  Dose decreased yesterday afternoon so should only get 34 units this 24 hour period.  Would  not reduce Lantus further today. Correction (SSI): Decrease correction Novolog to sensitive tid with meal and HS. Insulin - Meal Coverage: Decreased Novolog meal coverage to 5 units tid with meals. HgbA1C: . Outpatient Referral: Will fax referral at discharge.

## 2011-05-29 NOTE — Progress Notes (Signed)
Briefly spoke to patient regarding goal blood glucoses and proper treatment of hypoglycemia 15/15 rule.  Also discussed the actions of Lantus and Novolog.

## 2011-05-29 NOTE — Progress Notes (Signed)
Subjective: 30yoM with h/o DM type 1 and prior DKA presents with DKA.  Having hypoglycemic episodes. He has noted hypoglycemia at home on Lantus 20 BID as well usually in the AM with higher sugars after lunch and dinner. He was increased to 30 BID by Dr Aileen Fass.   Objective: Weight change:   Intake/Output Summary (Last 24 hours) at 05/29/11 1611 Last data filed at 05/29/11 0500  Gross per 24 hour  Intake    480 ml  Output    301 ml  Net    179 ml   Blood pressure 135/84, pulse 68, temperature 98 F (36.7 C), temperature source Oral, resp. rate 18, height 6' (1.829 m), weight 68.6 kg (151 lb 3.8 oz), SpO2 98.00%.  Physical Exam: General: No acute respiratory distress Lungs: Clear to auscultation bilaterally without wheezes or crackles Cardiovascular: Regular rate and rhythm without murmur gallop or rub normal S1 and S2 Abdomen: Nontender, nondistended, soft, bowel sounds positive, no rebound, no ascites, no appreciable mass Extremities: No significant cyanosis, clubbing, or edema bilateral lower extremities  Lab Results:  Basename 05/29/11 0633 05/27/11 1035 05/27/11 0833  NA 141 140 142  K 3.0* 4.2 4.5  CL 102 109 110  CO2 27 22 22   GLUCOSE 69* 104* 126*  BUN 7 15 15   CREATININE 0.74 0.89 0.95  CALCIUM 9.2 8.5 8.8  MG -- -- --  PHOS -- -- --    Basename 05/29/11 0633 05/27/11 0400 05/26/11 2324  WBC 9.4 23.0* 32.2*  NEUTROABS -- -- 29.6*  HGB 13.9 12.7* 16.2  HCT 40.3 37.2* 46.0  MCV 79.3 81.2 80.7  PLT 178 158 224    Basename 05/27/11 1438  HGBA1C 12.2*    Micro Results: Recent Results (from the past 240 hour(s))  MRSA PCR SCREENING     Status: Normal   Collection Time   05/27/11  3:25 AM      Component Value Range Status Comment   MRSA by PCR NEGATIVE  NEGATIVE  Final     Studies/Results: All recent x-ray/radiology reports have been reviewed in detail.   Medications: I have reviewed the patient's complete medication  list.  Assessment/Plan:  DKA Due to noncompliance with insulin therapy-have educated patient on extreme danger of recurrent DKA-education continues Trying to get an appt with Dr Sharl Ma- Spoke with his secretary today who states the earliest appt is on Feb 26th. She will notify me is she is able to make it earlier after she speaks with Dr Sharl Ma.   Uncontrolled diabetes mellitus type 1 CBGs are persistently low today- He received 20 u of lantus last night which has been decreased to 17 BID for today.  I have also decreased his sliding scale and mealtime coverage for now.  continue to adjust insulin regimen-we need to be sure that hypoglycemia does not recur and that CBGs are better controlled prior to discharge home  HTN will resume Lisinopril today  Leukocytosis Resolved- likely stress response.-no evidence of acute infection  Calvert Cantor MD [facility] Office  [phone] Pager [phone]  On-Call/Text Page:      [email or web address]      password [facility]

## 2011-05-30 LAB — GLUCOSE, CAPILLARY
Glucose-Capillary: 107 mg/dL — ABNORMAL HIGH (ref 70–99)
Glucose-Capillary: 63 mg/dL — ABNORMAL LOW (ref 70–99)
Glucose-Capillary: 100 mg/dL — ABNORMAL HIGH (ref 70–99)
Glucose-Capillary: 169 mg/dL — ABNORMAL HIGH (ref 70–99)

## 2011-05-30 MED ORDER — INSULIN ASPART 100 UNIT/ML ~~LOC~~ SOLN: 4.0000 [IU] | Freq: Three times a day (TID) | SUBCUTANEOUS | Status: DC

## 2011-05-30 MED ORDER — POTASSIUM CHLORIDE CRYS ER 20 MEQ PO TBCR
40.0000 meq | EXTENDED_RELEASE_TABLET | Freq: Once | ORAL | Status: AC
  Administered 2011-05-30: 40 meq via ORAL
  Filled 2011-05-30: qty 2

## 2011-05-30 MED ORDER — ONDANSETRON HCL 4 MG PO TABS: 4.0000 mg | ORAL_TABLET | Freq: Four times a day (QID) | ORAL | Status: AC | PRN

## 2011-05-30 MED ORDER — ACETONE (URINE) TEST VI STRP: 1.0000 | ORAL_STRIP | Status: DC | PRN

## 2011-05-30 MED ORDER — INSULIN GLARGINE 100 UNIT/ML ~~LOC~~ SOLN: 17.0000 [IU] | Freq: Two times a day (BID) | SUBCUTANEOUS | Status: DC

## 2011-05-30 NOTE — Discharge Summary (Signed)
DISCHARGE SUMMARY  Richard Bray  MR#: [id number]  DOB:1981/03/24  Date of Admission: 05/26/2011 Date of Discharge: 05/30/2011  Attending Physician:Wladyslaw Henrichs T  Patient's PCP:No primary provider on file/newly relocated to [place] area  Consults: none  Discharge Diagnoses: Present on Admission:  .DKA, type 1, not at goal .Type 1 diabetes mellitus    Current Discharge Medication List    START taking these medications   Details  acetone, urine, test strip 1 strip by Does not apply route as needed. Qty: 25 each, Refills: 0    !! insulin aspart (NOVOLOG) 100 UNIT/ML injection Inject 4 Units into the skin 3 (three) times daily with meals. Qty: 1 pen, Refills: prn    ondansetron (ZOFRAN) 4 MG tablet Take 1 tablet (4 mg total) by mouth every 6 (six) hours as needed for nausea. Qty: 20 tablet, Refills: 0     !! - Potential duplicate medications found. Please discuss with provider.    CONTINUE these medications which have CHANGED   Details  insulin glargine (LANTUS) 100 UNIT/ML injection Inject 17 Units into the skin 2 (two) times daily. Qty: 3 mL, Refills: prn      CONTINUE these medications which have NOT CHANGED   Details  !! insulin aspart (NOVOLOG) 100 UNIT/ML injection Inject 2-10 Units into the skin 3 (three) times daily before meals.      lisinopril (PRINIVIL,ZESTRIL) 20 MG tablet Take 20 mg by mouth daily.       !! - Potential duplicate medications found. Please discuss with provider.       Hospital Course:  30yoM with h/o DM type 1 and prior DKA presents with DKA.  Pt states he has been out of insulin for the past couple days. Pt was feeling well, in usual  state of health until yesterday, when he developed abdominal pain, that persisted until the am of 1/7.   The patient was admitted to the acute units and treated with intravenous insulin, aggressive volume resuscitation, and careful electrolyte management.  He quickly corrected.  He was able to be  transitioned to Lantus and sliding scale insulin along with meal coverage. With strict dietary compliance he actually developed hypoglycemia on his home Lantus regimen.  As a result his regimen was titrated.  At the time of his discharge his CBG remains stable.  He has been reeducated on appropriate care of his diabetes and the need for very strict compliance with his diabetes medications.  He possesses significant insight into the serious nature of his disease and simply appears to have run out of insulin in the face of having no local physician.  Prior to his discharge he has been established via case management with a local endocrinologist for followup.  Day of Discharge BP 107/70  Pulse 62  Temp(Src) 97.9 F (36.6 C) (Oral)  Resp 19  Ht 6' (1.829 m)  Wt 68.6 kg (151 lb 3.8 oz)  BMI 20.51 kg/m2  SpO2 98%  Physical Exam: General: No acute respiratory distress Lungs: Clear to auscultation bilaterally without wheezes or crackles Cardiovascular: Regular rate and rhythm without murmur gallop or rub normal S1 and S2 Abdomen: Nontender, nondistended, soft, bowel sounds positive, no rebound, no ascites, no appreciable mass Extremities: No significant cyanosis, clubbing, or edema bilateral lower extremities   Results for orders placed during the hospital encounter of 05/26/11 (from the past 24 hour(s))  GLUCOSE, CAPILLARY     Status: Normal   Collection Time   05/29/11  4:21 PM  Component Value Range   Glucose-Capillary 73  70 - 99 (mg/dL)   Comment 1 Notify RN    GLUCOSE, CAPILLARY     Status: Abnormal   Collection Time   05/29/11  9:09 PM      Component Value Range   Glucose-Capillary 157 (*) 70 - 99 (mg/dL)  GLUCOSE, CAPILLARY     Status: Abnormal   Collection Time   05/30/11  4:37 AM      Component Value Range   Glucose-Capillary 63 (*) 70 - 99 (mg/dL)   Comment 1 Documented in Chart     Comment 2 Notify RN    GLUCOSE, CAPILLARY     Status: Abnormal   Collection Time    05/30/11  5:24 AM      Component Value Range   Glucose-Capillary 107 (*) 70 - 99 (mg/dL)  GLUCOSE, CAPILLARY     Status: Abnormal   Collection Time   05/30/11  6:35 AM      Component Value Range   Glucose-Capillary 100 (*) 70 - 99 (mg/dL)   Comment 1 Documented in Chart     Comment 2 Notify RN    GLUCOSE, CAPILLARY     Status: Abnormal   Collection Time   05/30/11 11:38 AM      Component Value Range   Glucose-Capillary 169 (*) 70 - 99 (mg/dL)    Disposition: Discharge home   Follow-up Appts: Discharge Orders    Future Orders Please Complete By Expires   Diet Carb Modified      Care order/instruction      Scheduling Instructions:   Please give patient his current hospital insulin pens to take home with him at time of D/C   Increase activity slowly         Follow-up Information    Follow up with [facility] on 06/13/2011. (AT 9AM)    Contact information:   [address] [facility] [place] [address] [phone]          Tests Needing Follow-up: A repeat potassium level is suggested at the time of the patient's followup evaluation.  Time spent in discharge (includes decision making & examination of pt): <30 minutes  Signed: Larayne Baxley T 05/30/2011, 2:18 PM

## 2011-05-30 NOTE — Progress Notes (Signed)
CBG: 63  Treatment: 15 GM carbohydrate snack  Symptoms: Shaky  Follow-up CBG: Time: 0510 CBG Result: 107  Possible Reasons for Event: Inadequate meal intake and Medication regimen:   Comments/MD notified: Hypoglygemia resolved c intervention   Potts, Marylynn Pearson RN

## 2011-05-30 NOTE — Discharge Summary (Signed)
SL removed from R FA, no bleeding noted, catheter tip intact.  Pt given dc instructions along with f/u apt and prescriptions.  Carb mod diet and activity for diabetics discussed. Pt to keep a log of BS until f/u apt.  Pt d/c'd home via w/c accompanied by medical staff and significant other.

## 2011-05-30 NOTE — Progress Notes (Signed)
   CARE MANAGEMENT NOTE 05/30/2011  Patient:  JAISHAUN, MCNAB   Account Number:  [id number]  Date Initiated:  05/28/2011  Documentation initiated by:  Onnie Boer  Subjective/Objective Assessment:   PT WAS ADMITTED WITH DKA     Action/Plan:   PROGRESSION OF CARE AND DISCHARGE PLANNING   Anticipated DC Date:  05/31/2011   Anticipated DC Plan:  HOME/SELF CARE      DC Planning Services  CM consult      Choice offered to / List presented to:             Status of service:  Completed, signed off Medicare Important Message given?   (If response is "NO", the following Medicare IM given date fields will be blank) Date Medicare IM given:   Date Additional Medicare IM given:    Discharge Disposition:  HOME/SELF CARE  Per UR Regulation:  Reviewed for med. necessity/level of care/duration of stay  Comments:  05/30/11 Roger Kill, BSN 1550 PT DC'D TO HOME WITH SELF CARE.  F/U APPT MADE WITH DR. Lucianne Muss FOR JAN 25TH AT 9 AM.  PT REQUESTED DR. KERR, HE WAS BOOKED UNTIL THE END OF FEB AND THOUGHT THAT THE PT SHOULD BE SEEN SOONER.  05/28/11 Onnie Boer, RN, BSN 1415 UR COMPLETED PT WAS ADMITTED FROM HOME WITH SELF CARE AFTER DZ OF DKA. PT STATES THAT HE RAN OUT OF HIS MEDS AND ONCE HE GOT THEM FILLED HE WAS IN DKA.   NO DC NEEDS AT THIS TIME.

## 2011-05-30 NOTE — Progress Notes (Signed)
Inpatient Diabetes Program Recommendations  AACE/ADA: New Consensus Statement on Inpatient Glycemic Control (2009)  Target Ranges:  Prepandial:   less than 140 mg/dL      Peak postprandial:   less than 180 mg/dL (1-2 hours)      Critically ill patients:  140 - 180 mg/dL   Reason for Visit:Results for KAIDEN, PECH (MRN [id number]) as of 05/30/2011 08:45  Ref. Range 05/29/2011 16:21 05/29/2011 21:09 05/30/2011 04:37 05/30/2011 05:24 05/30/2011 06:35  Glucose-Capillary Latest Range: 70-99 mg/dL 73 409 (H) 63 (L) 811 (H) 100 (H)    Inpatient Diabetes Program Recommendations Insulin - Basal: Note CBG=63 mg/dL this AM.  Decrease Lantus to 15 units bid. Correction (SSI): . Insulin - Meal Coverage: . HgbA1C: . Outpatient Referral: Will fax referral at discharge.  Note: Note patient is interested in referral to endocrinologist at discharge.  Thanks,

## 2011-05-30 NOTE — Progress Notes (Signed)
PT WILL HAVE A FOLLOW UP APPT WITH DR Lucianne Muss AT [facility] ON JAN 25TH AT 9AM.  PT ASKED FOR DR Sharl Ma, HOWEVER HE HAS NO APPTS UNTIL THE END OF FEB AND FEELS THAT HE NEEDS TO BE SEEN SOONER.  WILL INFORM PT. Willa Rough 05/30/2011 [phone] OR [phone]

## 2012-05-23 ENCOUNTER — Emergency Department: Payer: Self-pay | Admitting: Emergency Medicine

## 2012-05-23 LAB — URINALYSIS, COMPLETE
Bilirubin,UR: NEGATIVE
Glucose,UR: >=500 mg/dL (ref 0–75)
RBC,UR: <1 /HPF (ref 0–5)
Squamous Epithelial: NONE SEEN

## 2012-05-23 LAB — CBC
HCT: 42 % (ref 40.0–52.0)
HGB: 13.8 g/dL (ref 13.0–18.0)
MCH: 27.4 pg (ref 26.0–34.0)
MCHC: 32.9 g/dL (ref 32.0–36.0)
Platelet: 181 10*3/uL (ref 150–440)
RDW: 13.4 % (ref 11.5–14.5)

## 2012-05-23 LAB — COMPREHENSIVE METABOLIC PANEL
Alkaline Phosphatase: 195 U/L — ABNORMAL HIGH (ref 50–136)
Anion Gap: 16 (ref 7–16)
BUN: 18 mg/dL (ref 7–18)
Calcium, Total: 8.6 mg/dL (ref 8.5–10.1)
EGFR (Non-African Amer.): >60
Potassium: 4 mmol/L (ref 3.5–5.1)
SGPT (ALT): 35 U/L (ref 12–78)
Sodium: 140 mmol/L (ref 136–145)
Total Protein: 7.7 g/dL (ref 6.4–8.2)

## 2012-08-17 ENCOUNTER — Emergency Department: Payer: Self-pay | Admitting: Emergency Medicine

## 2012-08-17 LAB — COMPREHENSIVE METABOLIC PANEL
BUN: 21 mg/dL — ABNORMAL HIGH (ref 7–18)
Calcium, Total: 9.5 mg/dL (ref 8.5–10.1)
Chloride: 103 mmol/L (ref 98–107)
EGFR (African American): >60
Potassium: 3.8 mmol/L (ref 3.5–5.1)
SGOT(AST): 23 U/L (ref 15–37)

## 2012-08-17 LAB — CBC
HGB: 13.7 g/dL (ref 13.0–18.0)
MCH: 27.3 pg (ref 26.0–34.0)
MCHC: 33.4 g/dL (ref 32.0–36.0)
MCV: 82 fL (ref 80–100)
Platelet: 228 10*3/uL (ref 150–440)
WBC: 16.4 10*3/uL — ABNORMAL HIGH (ref 3.8–10.6)

## 2012-08-18 LAB — URINALYSIS, COMPLETE
Ph: 5 (ref 4.5–8.0)
Squamous Epithelial: <1

## 2013-01-24 IMAGING — CR DG CHEST 2V
2 series · 2 of 2 positions shown · non-contrast
Comparison: None

CLINICAL DATA: Vomiting

CHEST - 2 VIEW

[w chest pa]
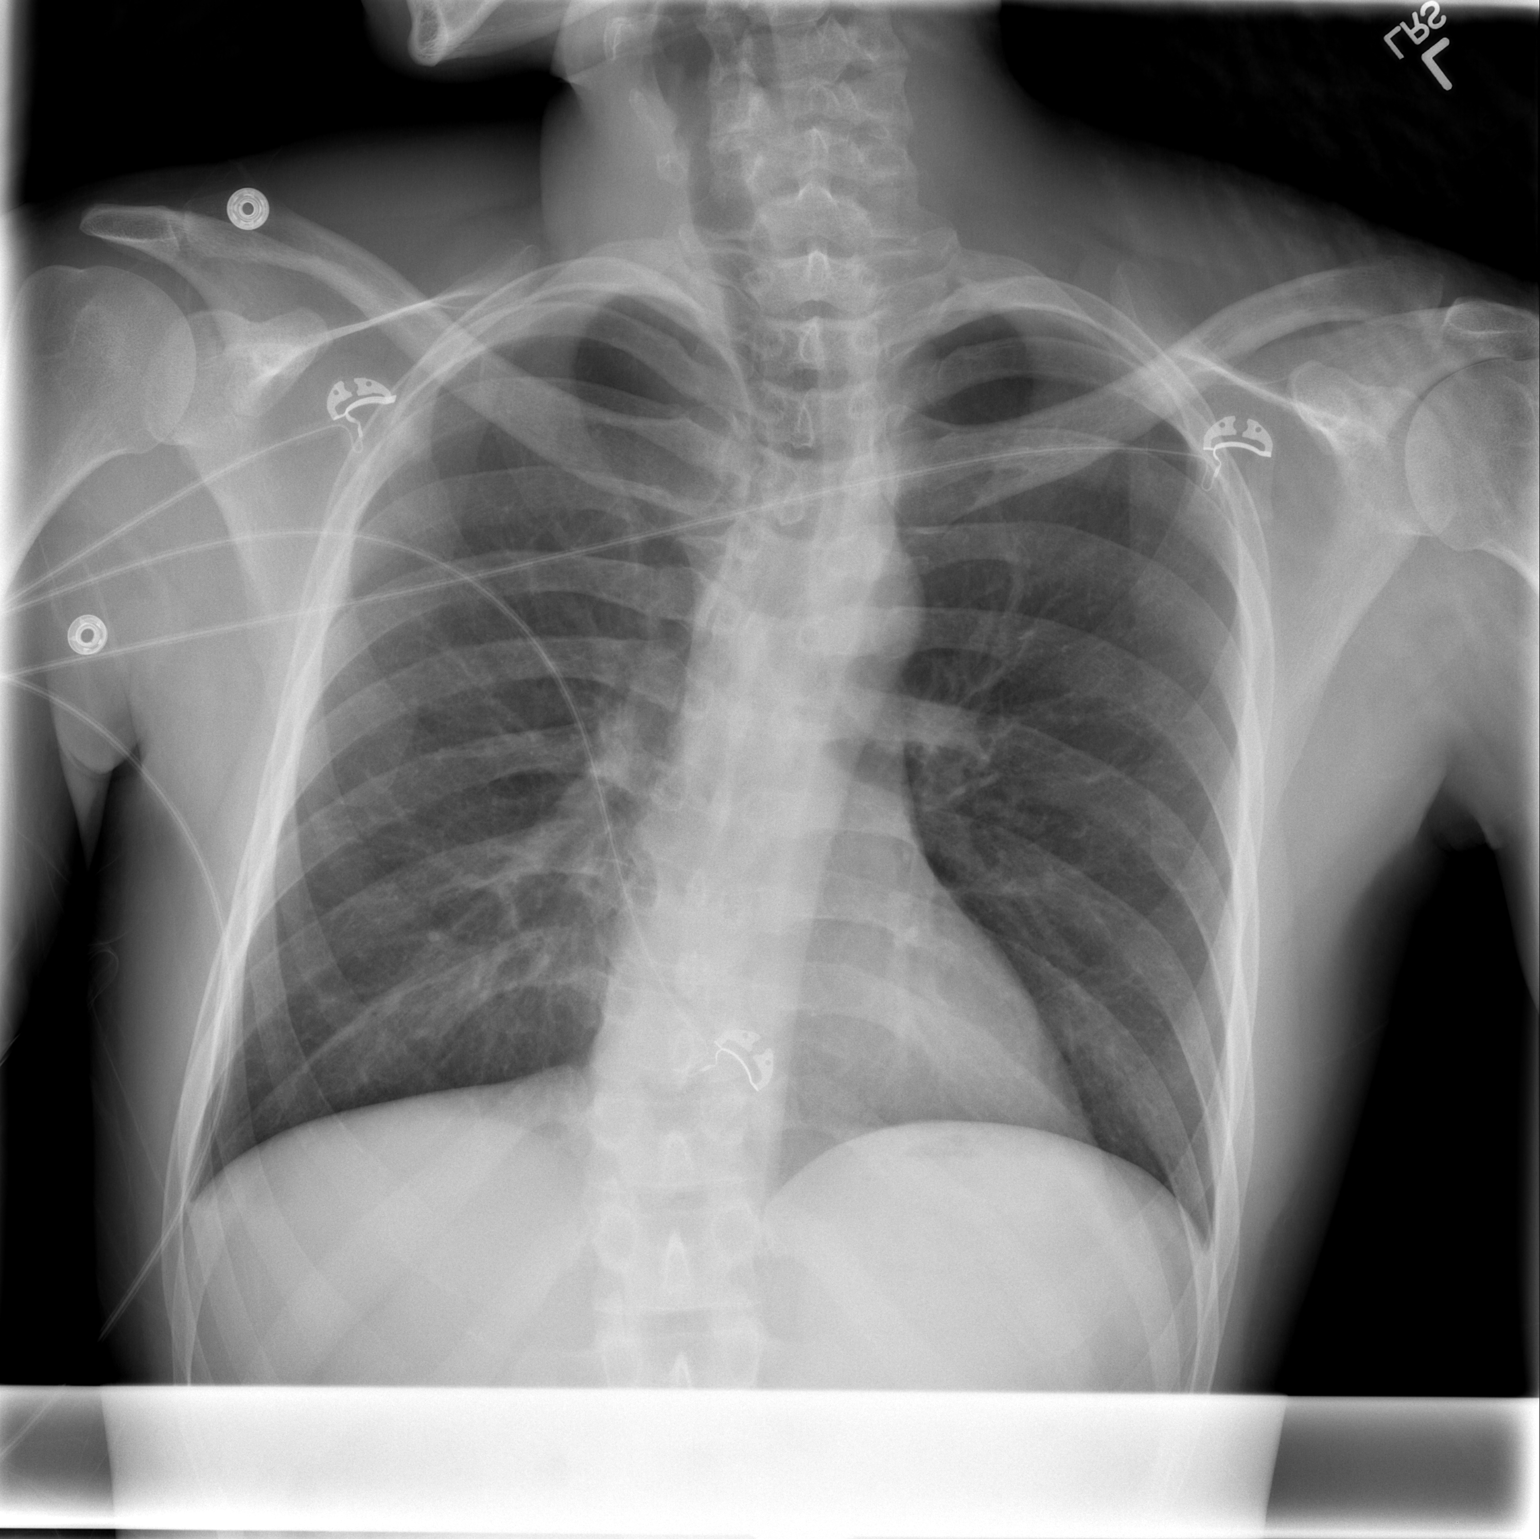

[w chest lat]
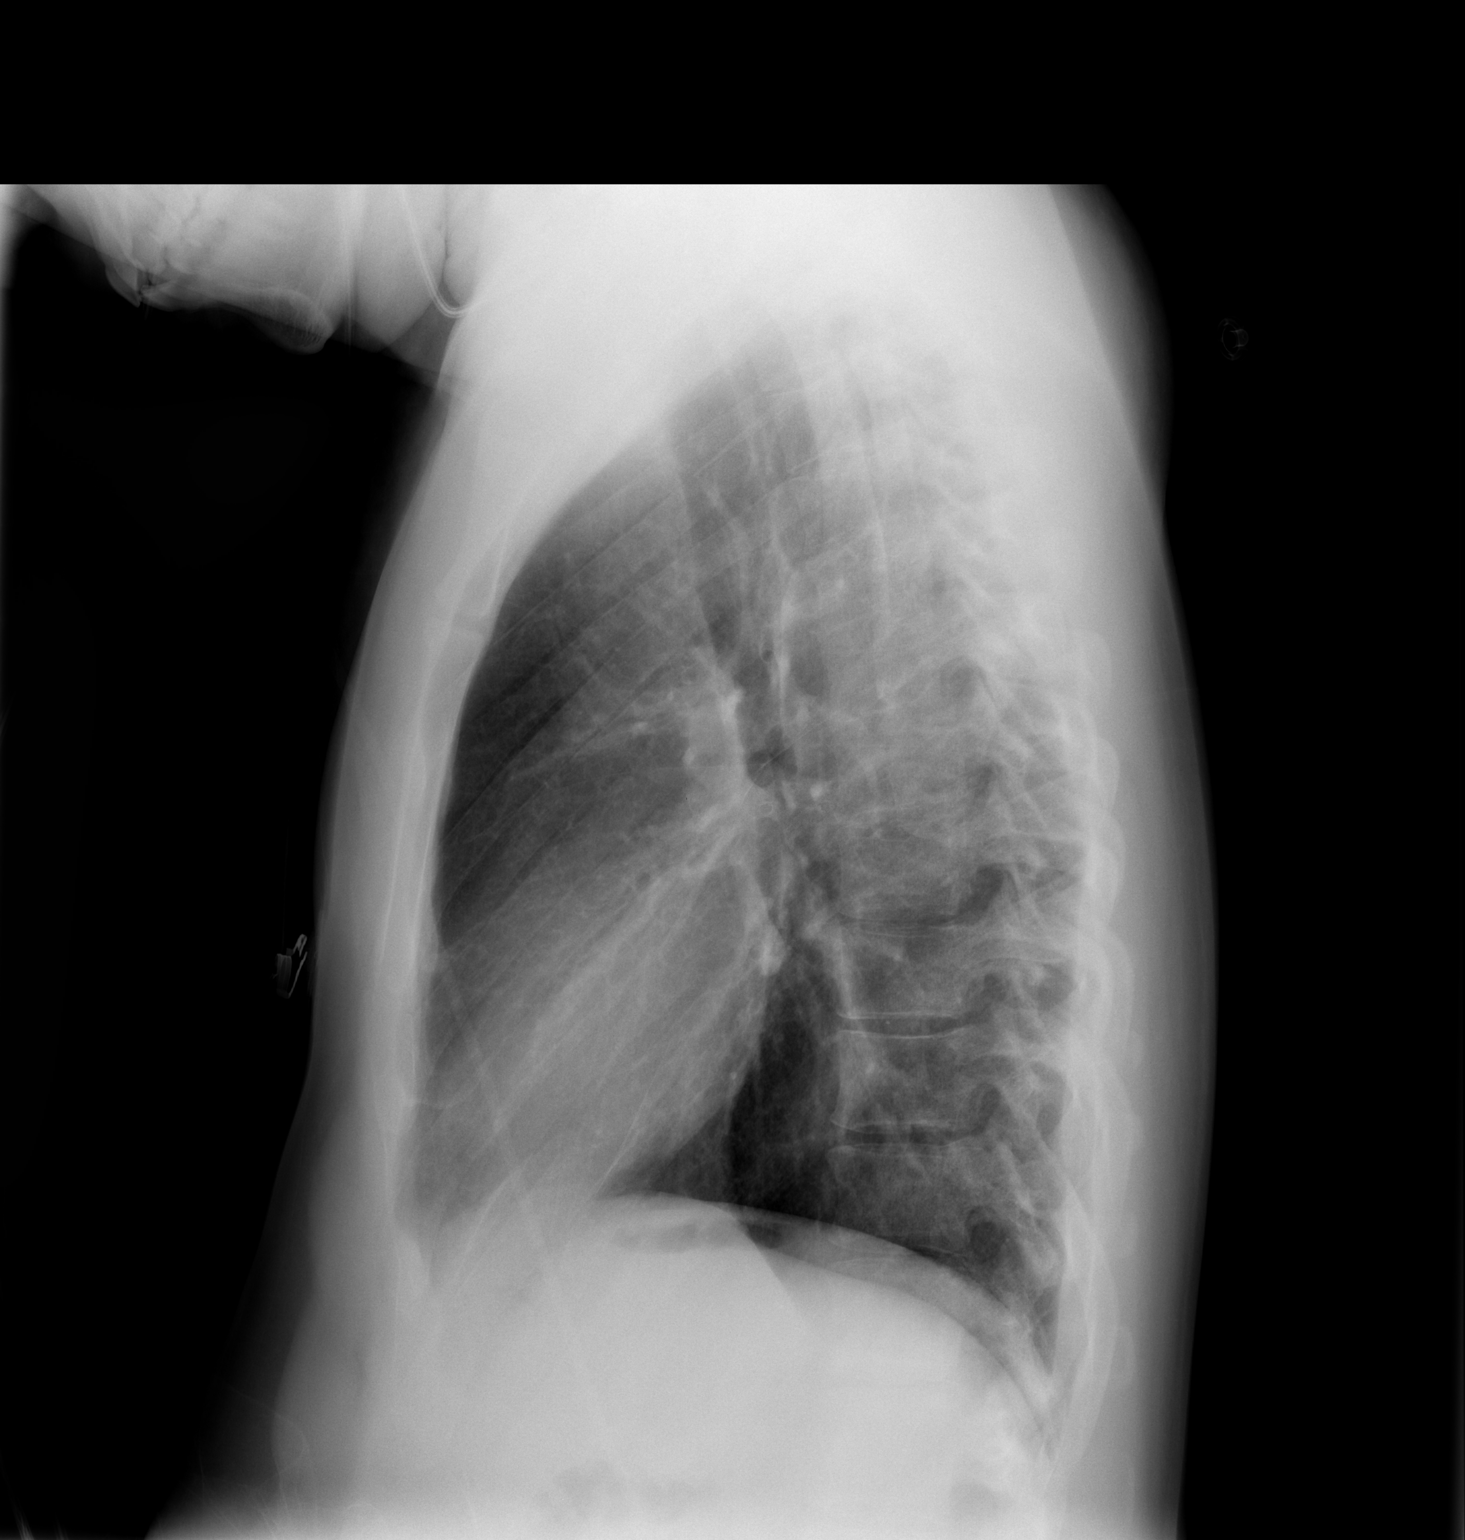

[2 of 2 positions shown; findings below may reference images not displayed]

FINDINGS: Normal mediastinum and heart silhouette.  There is
scoliosis of the spine.  No effusion, infiltrate, pneumothorax.
IMPRESSION: No acute cardiopulmonary process.

Scoliosis.

## 2014-01-25 ENCOUNTER — Emergency Department: Payer: Self-pay | Admitting: Emergency Medicine

## 2014-06-06 ENCOUNTER — Emergency Department: Payer: Self-pay | Admitting: Emergency Medicine

## 2014-06-06 LAB — COMPREHENSIVE METABOLIC PANEL
ALBUMIN: 4.6 g/dL (ref 3.4–5.0)
ANION GAP: 17 — AB (ref 7–16)
Alkaline Phosphatase: 191 U/L — ABNORMAL HIGH
BUN: 11 mg/dL (ref 7–18)
Bilirubin,Total: 1.2 mg/dL — ABNORMAL HIGH (ref 0.2–1.0)
CALCIUM: 9.3 mg/dL (ref 8.5–10.1)
Chloride: 101 mmol/L (ref 98–107)
Co2: 19 mmol/L — ABNORMAL LOW (ref 21–32)
Creatinine: 1.08 mg/dL (ref 0.60–1.30)
EGFR (Non-African Amer.): >60
GLUCOSE: 436 mg/dL — AB (ref 65–99)
OSMOLALITY: 292 (ref 275–301)
POTASSIUM: 3.8 mmol/L (ref 3.5–5.1)
SGOT(AST): 30 U/L (ref 15–37)
SGPT (ALT): 43 U/L
SODIUM: 137 mmol/L (ref 136–145)
Total Protein: 7.7 g/dL (ref 6.4–8.2)

## 2014-06-06 LAB — URINALYSIS, COMPLETE
BACTERIA: NONE SEEN
BLOOD: NEGATIVE
Bilirubin,UR: NEGATIVE
Leukocyte Esterase: NEGATIVE
NITRITE: NEGATIVE
PROTEIN: NEGATIVE
Ph: 5 (ref 4.5–8.0)
RBC,UR: <1 /HPF (ref 0–5)
SPECIFIC GRAVITY: 1.015 (ref 1.003–1.030)
Squamous Epithelial: NONE SEEN
WBC UR: <1 /HPF (ref 0–5)

## 2014-06-06 LAB — BASIC METABOLIC PANEL
Anion Gap: 8 (ref 7–16)
BUN: 10 mg/dL (ref 7–18)
CALCIUM: 8 mg/dL — AB (ref 8.5–10.1)
CHLORIDE: 110 mmol/L — AB (ref 98–107)
Co2: 25 mmol/L (ref 21–32)
Creatinine: 0.92 mg/dL (ref 0.60–1.30)
EGFR (African American): >60
EGFR (Non-African Amer.): >60
Glucose: 193 mg/dL — ABNORMAL HIGH (ref 65–99)
OSMOLALITY: 289 (ref 275–301)
POTASSIUM: 3.8 mmol/L (ref 3.5–5.1)
Sodium: 143 mmol/L (ref 136–145)

## 2014-06-06 LAB — CBC
HCT: 44 % (ref 40.0–52.0)
HGB: 14.1 g/dL (ref 13.0–18.0)
MCH: 27.1 pg (ref 26.0–34.0)
MCHC: 32 g/dL (ref 32.0–36.0)
MCV: 85 fL (ref 80–100)
Platelet: 267 10*3/uL (ref 150–440)
RBC: 5.19 10*6/uL (ref 4.40–5.90)
RDW: 13.3 % (ref 11.5–14.5)
WBC: 18.4 10*3/uL — ABNORMAL HIGH (ref 3.8–10.6)

## 2014-10-30 ENCOUNTER — Emergency Department
Admission: EM | Admit: 2014-10-30 | Discharge: 2014-10-31 | Disposition: A | Discharge status: Home / Self Care | Payer: No Typology Code available for payment source | Attending: Emergency Medicine | Admitting: Emergency Medicine

## 2014-10-30 ENCOUNTER — Encounter: Payer: Self-pay | Admitting: Emergency Medicine

## 2014-10-30 DIAGNOSIS — E1010 Type 1 diabetes mellitus with ketoacidosis without coma: Secondary | ICD-10-CM | POA: Diagnosis not present

## 2014-10-30 DIAGNOSIS — E1065 Type 1 diabetes mellitus with hyperglycemia: Secondary | ICD-10-CM | POA: Diagnosis not present

## 2014-10-30 DIAGNOSIS — Z79899 Other long term (current) drug therapy: Secondary | ICD-10-CM | POA: Insufficient documentation

## 2014-10-30 DIAGNOSIS — Z794 Long term (current) use of insulin: Secondary | ICD-10-CM | POA: Diagnosis not present

## 2014-10-30 LAB — COMPREHENSIVE METABOLIC PANEL
ALT: 40 U/L (ref 17–63)
AST: 48 U/L — ABNORMAL HIGH (ref 15–41)
Albumin: 5.1 g/dL — ABNORMAL HIGH (ref 3.5–5.0)
Alkaline Phosphatase: 175 U/L — ABNORMAL HIGH (ref 38–126)
Anion gap: 20 — ABNORMAL HIGH (ref 5–15)
BUN: 19 mg/dL (ref 6–20)
CALCIUM: 9.8 mg/dL (ref 8.9–10.3)
CO2: 19 mmol/L — AB (ref 22–32)
CREATININE: 1.31 mg/dL — AB (ref 0.61–1.24)
Chloride: 101 mmol/L (ref 101–111)
GFR calc Af Amer: >60 mL/min (ref >60)
GLUCOSE: 352 mg/dL — AB (ref 65–99)
Potassium: 3.7 mmol/L (ref 3.5–5.1)
SODIUM: 140 mmol/L (ref 135–145)
Total Bilirubin: 1.1 mg/dL (ref 0.3–1.2)
Total Protein: 8 g/dL (ref 6.5–8.1)

## 2014-10-30 LAB — CBC WITH DIFFERENTIAL/PLATELET
BASOS ABS: 0.1 10*3/uL (ref 0–0.1)
BASOS PCT: 1 %
EOS PCT: 0 %
Eosinophils Absolute: 0 10*3/uL (ref 0–0.7)
HCT: 43.7 % (ref 40.0–52.0)
Hemoglobin: 14 g/dL (ref 13.0–18.0)
LYMPHS ABS: 1.4 10*3/uL (ref 1.0–3.6)
LYMPHS PCT: 6 %
MCH: 27.1 pg (ref 26.0–34.0)
MCHC: 32.1 g/dL (ref 32.0–36.0)
MCV: 84.4 fL (ref 80.0–100.0)
Monocytes Absolute: 0.8 10*3/uL (ref 0.2–1.0)
Monocytes Relative: 4 %
NEUTROS PCT: 89 %
Neutro Abs: 19.2 10*3/uL — ABNORMAL HIGH (ref 1.4–6.5)
PLATELETS: 247 10*3/uL (ref 150–440)
RBC: 5.18 MIL/uL (ref 4.40–5.90)
RDW: 13 % (ref 11.5–14.5)
WBC: 21.5 10*3/uL — AB (ref 3.8–10.6)

## 2014-10-30 LAB — GLUCOSE, CAPILLARY
GLUCOSE-CAPILLARY: 350 mg/dL — AB (ref 65–99)
Glucose-Capillary: 162 mg/dL — ABNORMAL HIGH (ref 65–99)

## 2014-10-30 LAB — URINALYSIS COMPLETE WITH MICROSCOPIC (ARMC ONLY)
BILIRUBIN URINE: NEGATIVE
Bacteria, UA: NONE SEEN
HGB URINE DIPSTICK: NEGATIVE
Leukocytes, UA: NEGATIVE
Nitrite: NEGATIVE
PH: 5 (ref 5.0–8.0)
Protein, ur: NEGATIVE mg/dL
SPECIFIC GRAVITY, URINE: 1.017 (ref 1.005–1.030)
Squamous Epithelial / LPF: NONE SEEN

## 2014-10-30 LAB — BETA-HYDROXYBUTYRIC ACID: Beta-Hydroxybutyric Acid: 6.27 mmol/L — ABNORMAL HIGH (ref 0.05–0.27)

## 2014-10-30 LAB — BLOOD GAS, VENOUS
Acid-base deficit: 4.1 mmol/L — ABNORMAL HIGH (ref 0.0–2.0)
Bicarbonate: 22.2 mEq/L (ref 21.0–28.0)
Patient temperature: 37
pCO2, Ven: 44 mmHg (ref 44.0–60.0)
pH, Ven: 7.31 — ABNORMAL LOW (ref 7.320–7.430)

## 2014-10-30 LAB — MAGNESIUM: MAGNESIUM: 2.2 mg/dL (ref 1.7–2.4)

## 2014-10-30 MED ORDER — SODIUM CHLORIDE 0.9 % IV BOLUS (SEPSIS)
1000.0000 mL | INTRAVENOUS | Status: AC
  Administered 2014-10-30: 1000 mL via INTRAVENOUS

## 2014-10-30 NOTE — Discharge Instructions (Signed)
It is very important that you follow up with Dr.Solum tomorrow to discuss how to change your insulin regimen now at you are working third shift. Also need to have a white blood cell count rechecked as it was elevated in the ER today. He turned to the emergency department if you continue to vomit, develop abdominal pain again, have fever, chest pain, difficulty breathing or for any other concerns.  Hyperglycemia Hyperglycemia occurs when the glucose (sugar) in your blood is too high. Hyperglycemia can happen for many reasons, but it most often happens to people who do not know they have diabetes or are not managing their diabetes properly.  CAUSES  Whether you have diabetes or not, there are other causes of hyperglycemia. Hyperglycemia can occur when you have diabetes, but it can also occur in other situations that you might not be as aware of, such as: Diabetes  If you have diabetes and are having problems controlling your blood glucose, hyperglycemia could occur because of some of the following reasons:  Not following your meal plan.  Not taking your diabetes medications or not taking it properly.  Exercising less or doing less activity than you normally do.  Being sick. Pre-diabetes  This cannot be ignored. Before people develop Type 2 diabetes, they almost always have "pre-diabetes." This is when your blood glucose levels are higher than normal, but not yet high enough to be diagnosed as diabetes. Research has shown that some long-term damage to the body, especially the heart and circulatory system, may already be occurring during pre-diabetes. If you take action to manage your blood glucose when you have pre-diabetes, you may delay or prevent Type 2 diabetes from developing. Stress  If you have diabetes, you may be "diet" controlled or on oral medications or insulin to control your diabetes. However, you may find that your blood glucose is higher than usual in the hospital whether you have  diabetes or not. This is often referred to as "stress hyperglycemia." Stress can elevate your blood glucose. This happens because of hormones put out by the body during times of stress. If stress has been the cause of your high blood glucose, it can be followed regularly by your caregiver. That way he/she can make sure your hyperglycemia does not continue to get worse or progress to diabetes. Steroids  Steroids are medications that act on the infection fighting system (immune system) to block inflammation or infection. One side effect can be a rise in blood glucose. Most people can produce enough extra insulin to allow for this rise, but for those who cannot, steroids make blood glucose levels go even higher. It is not unusual for steroid treatments to "uncover" diabetes that is developing. It is not always possible to determine if the hyperglycemia will go away after the steroids are stopped. A special blood test called an A1c is sometimes done to determine if your blood glucose was elevated before the steroids were started. SYMPTOMS  Thirsty.  Frequent urination.  Dry mouth.  Blurred vision.  Tired or fatigue.  Weakness.  Sleepy.  Tingling in feet or leg. DIAGNOSIS  Diagnosis is made by monitoring blood glucose in one or all of the following ways:  A1c test. This is a chemical found in your blood.  Fingerstick blood glucose monitoring.  Laboratory results. TREATMENT  First, knowing the cause of the hyperglycemia is important before the hyperglycemia can be treated. Treatment may include, but is not be limited to:  Education.  Change or adjustment in  medications.  Change or adjustment in meal plan.  Treatment for an illness, infection, etc.  More frequent blood glucose monitoring.  Change in exercise plan.  Decreasing or stopping steroids.  Lifestyle changes. HOME CARE INSTRUCTIONS   Test your blood glucose as directed.  Exercise regularly. Your caregiver will give  you instructions about exercise. Pre-diabetes or diabetes which comes on with stress is helped by exercising.  Eat wholesome, balanced meals. Eat often and at regular, fixed times. Your caregiver or nutritionist will give you a meal plan to guide your sugar intake.  Being at an ideal weight is important. If needed, losing as little as 10 to 15 pounds may help improve blood glucose levels. SEEK MEDICAL CARE IF:   You have questions about medicine, activity, or diet.  You continue to have symptoms (problems such as increased thirst, urination, or weight gain). SEEK IMMEDIATE MEDICAL CARE IF:   You are vomiting or have diarrhea.  Your breath smells fruity.  You are breathing faster or slower.  You are very sleepy or incoherent.  You have numbness, tingling, or pain in your feet or hands.  You have chest pain.  Your symptoms get worse even though you have been following your caregiver's orders.  If you have any other questions or concerns. Document Released: 10/29/2000 Document Revised: 07/28/2011 Document Reviewed: 09/01/2011 [facility] Patient Information 2015 [place], [place]. This information is not intended to replace advice given to you by your health care provider. Make sure you discuss any questions you have with your health care provider.

## 2014-10-30 NOTE — ED Notes (Signed)
Pt arrived to the ED accompanied by his wife for "DKA." Pt states that he is a diabetic Type 1 and he is experiencing abdominal pain N/V, "it feels like when I am gouing into DKA. Pt is AOx4 in moderate pain/nausea distress.

## 2014-10-30 NOTE — ED Provider Notes (Signed)
[facility] Emergency Department Provider Note  ____________________________________________  Time seen: Approximately 9:30 PM  I have reviewed the triage vital signs and the nursing notes.   HISTORY  Chief Complaint Hyperglycemia  HPI Richard Bray is a 34 y.o. male a history of insulin-dependent diabetes who presents to the emergency department for concerns that he is developing DKA. Patient was feeling well when he awoke and mowed the lawn. However at 11 AM he began to develop abdominal pain and nausea vomiting and has had approximately 7-8 episodes of nonbloody nonbilious emesis. When his wife checked his sugar at 8:30 PM the meter read critical high so she administered 20 units of NovoLog. Patient is beginning to feel back to normal and is no longer nauseated. He is requesting water. He also denies abdominal pain at this time. He has not had any fever. He had one episode of watery diarrhea today.  Patient started a new job proximally 2 weeks ago that is the third shift and he is having trouble managing his sugars since this time as he is no longer sleeping in one block. His sugars have been running up to the 500s during this time. He notes that he is planning to go back to Dr. Gabriel Carina to get advice about further image management.  Patient has been in DKA approximately 3-4 times in the past. He is currently on Levemir 24 units daily at bedtime and Humalog sliding scale insulin.  Past Medical History  Diagnosis Date  . Type 1 diabetes mellitus     Diagnosed at 34 yo with DKA / ICU admission  . Diabetes mellitus     Patient Active Problem List   Diagnosis Date Noted  . Type 1 diabetes mellitus     History reviewed. No pertinent past surgical history.  Current Outpatient Rx  Name  Route  Sig  Dispense  Refill  . acetone, urine, test strip   Does not apply   1 strip by Does not apply route as needed.   25 each   0   . insulin aspart (NOVOLOG) 100  UNIT/ML injection   Subcutaneous   Inject 2-10 Units into the skin 3 (three) times daily before meals.           Marland Kitchen EXPIRED: insulin aspart (NOVOLOG) 100 UNIT/ML injection   Subcutaneous   Inject 4 Units into the skin 3 (three) times daily with meals.   1 pen   prn   . EXPIRED: insulin glargine (LANTUS) 100 UNIT/ML injection   Subcutaneous   Inject 17 Units into the skin 2 (two) times daily.   3 mL   prn     lantus insulin pen    . lisinopril (PRINIVIL,ZESTRIL) 20 MG tablet   Oral   Take 20 mg by mouth daily.             Allergies Review of patient's allergies indicates no known allergies.  Family History  Problem Relation Age of Onset  . Sickle cell anemia Father   . Aneurysm Mother     Cerebral     Social History History  Substance Use Topics  . Smoking status: Never Smoker   . Smokeless tobacco: Never Used  . Alcohol Use: No    Review of Systems Constitutional: No fever/chills Eyes: No visual changes. ENT: No URI Cardiovascular: Denies chest pain. Respiratory: Denies shortness of breath. Gastrointestinal: See history of present illness   Musculoskeletal: Negative for back pain. Skin: Negative for rash. Neurological: Negative  for headaches, focal weakness or numbness. Psychiatric:Normal mood Endocrine:No weight change 10-point ROS otherwise negative.  ____________________________________________   PHYSICAL EXAM:  VITAL SIGNS: ED Triage Vitals  Enc Vitals Group     BP 10/30/14 2053 100/69 mmHg     Pulse Rate 10/30/14 2053 104     Resp 10/30/14 2053 20     Temp 10/30/14 2053 98 F (36.7 C)     Temp Source 10/30/14 2053 Oral     SpO2 10/30/14 2053 97 %     Weight 10/30/14 2053 164 lb (74.39 kg)     Height 10/30/14 2053 6' (1.829 m)     Head Cir --      Peak Flow --      Pain Score 10/30/14 2056 7     Pain Loc --      Pain Edu? --      Excl. in [place]? --     Constitutional: Alert and oriented. Well appearing and in no acute  distress. Eyes: Conjunctivae are normal. PERRL. EOMI. Head: Atraumatic. Nose: No congestion/rhinnorhea. Mouth/Throat: Mucous membranes are moist.  Oropharynx non-erythematous. Neck: No stridor.  NEXUS criteria negative Lymphatic: No cervical lymphadenopathy. Cardiovascular: Normal rate, regular rhythm. Grossly normal heart sounds.  Peripheral pulses 2+ B Respiratory: Normal respiratory effort.  No retractions. Lungs CTAB. Gastrointestinal: Soft and nontender. No distention.  Musculoskeletal: No lower extremity tenderness nor edema.  No calf TTP. Neurologic:  Normal speech and language. No gross focal neurologic deficits are appreciated. Speech is normal.  Skin:  Skin is warm, dry and intact. No rash noted. Psychiatric: Mood and affect are normal. Speech and behavior are normal.  ____________________________________________   LABS (all labs ordered are listed, but only abnormal results are displayed)  Labs Reviewed  BETA-HYDROXYBUTYRIC ACID - Abnormal; Notable for the following:    Beta-Hydroxybutyric Acid 6.27 (*)    All other components within normal limits  COMPREHENSIVE METABOLIC PANEL - Abnormal; Notable for the following:    CO2 19 (*)    Glucose, Bld 352 (*)    Creatinine, Ser 1.31 (*)    Albumin 5.1 (*)    AST 48 (*)    Alkaline Phosphatase 175 (*)    Anion gap 20 (*)    All other components within normal limits  CBC WITH DIFFERENTIAL/PLATELET - Abnormal; Notable for the following:    WBC 21.5 (*)    Neutro Abs 19.2 (*)    All other components within normal limits  URINALYSIS COMPLETEWITH MICROSCOPIC ([facility] ONLY) - Abnormal; Notable for the following:    Color, Urine YELLOW (*)    APPearance CLEAR (*)    Glucose, UA >500 (*)    Ketones, ur 2+ (*)    All other components within normal limits  GLUCOSE, CAPILLARY - Abnormal; Notable for the following:    Glucose-Capillary 350 (*)    All other components within normal limits  BLOOD GAS, VENOUS - Abnormal; Notable for  the following:    pH, Ven 7.31 (*)    Acid-base deficit 4.1 (*)    All other components within normal limits  GLUCOSE, CAPILLARY - Abnormal; Notable for the following:    Glucose-Capillary 162 (*)    All other components within normal limits  MAGNESIUM   ____________________________________________ ____________________________________________   PROCEDURES  Procedure(s) performed: None  Critical Care performed: No  ____________________________________________   INITIAL IMPRESSION / ASSESSMENT AND PLAN / ED COURSE  Pertinent labs & imaging results that were available during my care of the patient were  reviewed by me and considered in my medical decision making (see chart for details).  ----------------------------------------- 11:22 PM on 10/30/2014 -----------------------------------------  FSBS from 352@9p  to 162@1050p  Patient is tolerating water by mouth and continues to deny abdominal pain. He states he continues to feel normal. On repeat exam abdomen is soft, nontender, nondistended. VBG patient was not acidotic. It does appear that he was starting to go into DKA but symptoms have resolved since receiving insulin at home and IV fluids in the ER. Patient and spouse advised to follow-up with primary care provider tomorrow to discuss management of insulin now that he is working third shift. Also advised to return to the ER should symptoms recur. ____________________________________________   FINAL CLINICAL IMPRESSION(S) / ED DIAGNOSES  Hyperglycemia      Ponciano Ort, MD 10/30/14 2328

## 2014-10-31 ENCOUNTER — Encounter: Payer: Self-pay | Admitting: Emergency Medicine

## 2014-10-31 ENCOUNTER — Emergency Department: Payer: No Typology Code available for payment source

## 2014-10-31 ENCOUNTER — Emergency Department
Admission: EM | Admit: 2014-10-31 | Discharge: 2014-10-31 | Disposition: A | Discharge status: Home / Self Care | Payer: No Typology Code available for payment source | Attending: Emergency Medicine | Admitting: Emergency Medicine

## 2014-10-31 ENCOUNTER — Inpatient Hospital Stay
Admission: EM | Admit: 2014-10-31 | Discharge: 2014-11-04 | DRG: 988 | Disposition: A | Discharge status: Home / Self Care | Payer: No Typology Code available for payment source | Attending: Surgery | Admitting: Surgery

## 2014-10-31 DIAGNOSIS — E109 Type 1 diabetes mellitus without complications: Secondary | ICD-10-CM | POA: Diagnosis present

## 2014-10-31 DIAGNOSIS — Z9049 Acquired absence of other specified parts of digestive tract: Secondary | ICD-10-CM | POA: Diagnosis present

## 2014-10-31 DIAGNOSIS — R112 Nausea with vomiting, unspecified: Secondary | ICD-10-CM | POA: Diagnosis present

## 2014-10-31 DIAGNOSIS — K8062 Calculus of gallbladder and bile duct with acute cholecystitis without obstruction: Secondary | ICD-10-CM | POA: Diagnosis present

## 2014-10-31 DIAGNOSIS — F1290 Cannabis use, unspecified, uncomplicated: Secondary | ICD-10-CM | POA: Diagnosis present

## 2014-10-31 DIAGNOSIS — M545 Low back pain: Secondary | ICD-10-CM | POA: Diagnosis present

## 2014-10-31 DIAGNOSIS — R109 Unspecified abdominal pain: Secondary | ICD-10-CM | POA: Insufficient documentation

## 2014-10-31 DIAGNOSIS — E1010 Type 1 diabetes mellitus with ketoacidosis without coma: Secondary | ICD-10-CM | POA: Insufficient documentation

## 2014-10-31 DIAGNOSIS — K819 Cholecystitis, unspecified: Secondary | ICD-10-CM | POA: Insufficient documentation

## 2014-10-31 DIAGNOSIS — Z79899 Other long term (current) drug therapy: Secondary | ICD-10-CM | POA: Diagnosis not present

## 2014-10-31 DIAGNOSIS — Z888 Allergy status to other drugs, medicaments and biological substances status: Secondary | ICD-10-CM | POA: Diagnosis not present

## 2014-10-31 DIAGNOSIS — R748 Abnormal levels of other serum enzymes: Secondary | ICD-10-CM | POA: Diagnosis present

## 2014-10-31 DIAGNOSIS — D1803 Hemangioma of intra-abdominal structures: Secondary | ICD-10-CM | POA: Diagnosis present

## 2014-10-31 DIAGNOSIS — K209 Esophagitis, unspecified: Secondary | ICD-10-CM | POA: Diagnosis present

## 2014-10-31 DIAGNOSIS — E1065 Type 1 diabetes mellitus with hyperglycemia: Secondary | ICD-10-CM | POA: Diagnosis present

## 2014-10-31 DIAGNOSIS — K2970 Gastritis, unspecified, without bleeding: Secondary | ICD-10-CM | POA: Diagnosis present

## 2014-10-31 DIAGNOSIS — Z794 Long term (current) use of insulin: Secondary | ICD-10-CM | POA: Diagnosis not present

## 2014-10-31 DIAGNOSIS — R1011 Right upper quadrant pain: Secondary | ICD-10-CM | POA: Insufficient documentation

## 2014-10-31 DIAGNOSIS — R1013 Epigastric pain: Secondary | ICD-10-CM

## 2014-10-31 DIAGNOSIS — E119 Type 2 diabetes mellitus without complications: Secondary | ICD-10-CM

## 2014-10-31 LAB — BLOOD GAS, ARTERIAL
ACID-BASE DEFICIT: 7.8 mmol/L — AB (ref 0.0–2.0)
ALLENS TEST (PASS/FAIL): POSITIVE — AB
Bicarbonate: 17.6 mEq/L — ABNORMAL LOW (ref 21.0–28.0)
FIO2: 0.21 %
O2 Saturation: 95.9 %
PCO2 ART: 35 mmHg (ref 32.0–48.0)
Patient temperature: 37
pH, Arterial: 7.31 — ABNORMAL LOW (ref 7.350–7.450)
pO2, Arterial: 89 mmHg (ref 83.0–108.0)

## 2014-10-31 LAB — BASIC METABOLIC PANEL
ANION GAP: 19 — AB (ref 5–15)
Anion gap: 10 (ref 5–15)
Anion gap: 13 (ref 5–15)
BUN: 12 mg/dL (ref 6–20)
BUN: 14 mg/dL (ref 6–20)
BUN: 16 mg/dL (ref 6–20)
CALCIUM: 8.3 mg/dL — AB (ref 8.9–10.3)
CALCIUM: 8.7 mg/dL — AB (ref 8.9–10.3)
CHLORIDE: 100 mmol/L — AB (ref 101–111)
CO2: 19 mmol/L — AB (ref 22–32)
CO2: 21 mmol/L — ABNORMAL LOW (ref 22–32)
CO2: 23 mmol/L (ref 22–32)
CREATININE: 0.97 mg/dL (ref 0.61–1.24)
CREATININE: 1.01 mg/dL (ref 0.61–1.24)
Calcium: 9.1 mg/dL (ref 8.9–10.3)
Chloride: 104 mmol/L (ref 101–111)
Chloride: 109 mmol/L (ref 101–111)
Creatinine, Ser: 1.28 mg/dL — ABNORMAL HIGH (ref 0.61–1.24)
GFR calc Af Amer: >60 mL/min (ref >60)
GFR calc Af Amer: >60 mL/min (ref >60)
GFR calc Af Amer: >60 mL/min (ref >60)
GFR calc non Af Amer: >60 mL/min (ref >60)
GFR calc non Af Amer: >60 mL/min (ref >60)
GLUCOSE: 117 mg/dL — AB (ref 65–99)
GLUCOSE: 127 mg/dL — AB (ref 65–99)
GLUCOSE: 369 mg/dL — AB (ref 65–99)
POTASSIUM: 4.3 mmol/L (ref 3.5–5.1)
POTASSIUM: 3.6 mmol/L (ref 3.5–5.1)
Potassium: 3.7 mmol/L (ref 3.5–5.1)
Sodium: 138 mmol/L (ref 135–145)
Sodium: 140 mmol/L (ref 135–145)
Sodium: 140 mmol/L (ref 135–145)

## 2014-10-31 LAB — BLOOD GAS, VENOUS
ACID-BASE DEFICIT: 12.5 mmol/L — AB (ref 0.0–2.0)
BICARBONATE: 14.3 meq/L — AB (ref 21.0–28.0)
Patient temperature: 37
pCO2, Ven: 35 mmHg — ABNORMAL LOW (ref 44.0–60.0)
pH, Ven: 7.22 — ABNORMAL LOW (ref 7.320–7.430)

## 2014-10-31 LAB — LIPASE, BLOOD: Lipase: 25 U/L (ref 22–51)

## 2014-10-31 LAB — CBC WITH DIFFERENTIAL/PLATELET
Basophils Absolute: 0.1 10*3/uL (ref 0–0.1)
Basophils Relative: 1 %
Eosinophils Absolute: 0 10*3/uL (ref 0–0.7)
Eosinophils Relative: 0 %
HCT: 34.4 % — ABNORMAL LOW (ref 40.0–52.0)
Hemoglobin: 11.2 g/dL — ABNORMAL LOW (ref 13.0–18.0)
LYMPHS ABS: 1.9 10*3/uL (ref 1.0–3.6)
LYMPHS PCT: 11 %
MCH: 27.6 pg (ref 26.0–34.0)
MCHC: 32.7 g/dL (ref 32.0–36.0)
MCV: 84.6 fL (ref 80.0–100.0)
Monocytes Absolute: 0.8 10*3/uL (ref 0.2–1.0)
Monocytes Relative: 5 %
NEUTROS PCT: 83 %
Neutro Abs: 14.2 10*3/uL — ABNORMAL HIGH (ref 1.4–6.5)
Platelets: 181 10*3/uL (ref 150–440)
RBC: 4.07 MIL/uL — ABNORMAL LOW (ref 4.40–5.90)
RDW: 12.7 % (ref 11.5–14.5)
WBC: 17.1 10*3/uL — ABNORMAL HIGH (ref 3.8–10.6)

## 2014-10-31 LAB — URINALYSIS COMPLETE WITH MICROSCOPIC (ARMC ONLY)
BILIRUBIN URINE: NEGATIVE
Bacteria, UA: NONE SEEN
Hgb urine dipstick: NEGATIVE
LEUKOCYTES UA: NEGATIVE
Nitrite: NEGATIVE
Protein, ur: NEGATIVE mg/dL
RBC / HPF: NONE SEEN RBC/hpf (ref 0–5)
SQUAMOUS EPITHELIAL / LPF: NONE SEEN
Specific Gravity, Urine: 1.016 (ref 1.005–1.030)
pH: 6 (ref 5.0–8.0)

## 2014-10-31 LAB — COMPREHENSIVE METABOLIC PANEL
ALBUMIN: 4.8 g/dL (ref 3.5–5.0)
ALT: 31 U/L (ref 17–63)
AST: 31 U/L (ref 15–41)
Alkaline Phosphatase: 157 U/L — ABNORMAL HIGH (ref 38–126)
Anion gap: 19 — ABNORMAL HIGH (ref 5–15)
BILIRUBIN TOTAL: 1 mg/dL (ref 0.3–1.2)
BUN: 10 mg/dL (ref 6–20)
CALCIUM: 8.9 mg/dL (ref 8.9–10.3)
CO2: 15 mmol/L — ABNORMAL LOW (ref 22–32)
Chloride: 104 mmol/L (ref 101–111)
Creatinine, Ser: 1.19 mg/dL (ref 0.61–1.24)
GFR calc Af Amer: >60 mL/min (ref >60)
GFR calc non Af Amer: >60 mL/min (ref >60)
Glucose, Bld: 217 mg/dL — ABNORMAL HIGH (ref 65–99)
POTASSIUM: 3.7 mmol/L (ref 3.5–5.1)
SODIUM: 138 mmol/L (ref 135–145)
Total Protein: 7.5 g/dL (ref 6.5–8.1)

## 2014-10-31 LAB — CBC
HCT: 39.4 % — ABNORMAL LOW (ref 40.0–52.0)
HEMOGLOBIN: 12.7 g/dL — AB (ref 13.0–18.0)
MCH: 27.5 pg (ref 26.0–34.0)
MCHC: 32.3 g/dL (ref 32.0–36.0)
MCV: 85.3 fL (ref 80.0–100.0)
PLATELETS: 230 10*3/uL (ref 150–440)
RBC: 4.62 MIL/uL (ref 4.40–5.90)
RDW: 13.2 % (ref 11.5–14.5)
WBC: 19.9 10*3/uL — AB (ref 3.8–10.6)

## 2014-10-31 MED ORDER — SODIUM CHLORIDE 0.9 % IV BOLUS (SEPSIS)
1000.0000 mL | Freq: Once | INTRAVENOUS | Status: AC
  Administered 2014-10-31: 1000 mL via INTRAVENOUS

## 2014-10-31 MED ORDER — ONDANSETRON HCL 4 MG/2ML IJ SOLN
INTRAMUSCULAR | Status: AC
  Administered 2014-10-31: 4 mg via INTRAVENOUS
  Filled 2014-10-31: qty 4

## 2014-10-31 MED ORDER — LORAZEPAM 2 MG/ML IJ SOLN
INTRAMUSCULAR | Status: AC
  Administered 2014-10-31: 1 mg via INTRAVENOUS
  Filled 2014-10-31: qty 1

## 2014-10-31 MED ORDER — INSULIN ASPART 100 UNIT/ML ~~LOC~~ SOLN
8.0000 [IU] | Freq: Once | SUBCUTANEOUS | Status: AC
  Administered 2014-10-31: 8 [IU] via INTRAVENOUS

## 2014-10-31 MED ORDER — SODIUM CHLORIDE 0.9 % IV SOLN
1000.0000 mL | Freq: Once | INTRAVENOUS | Status: AC
  Administered 2014-10-31: 1000 mL via INTRAVENOUS

## 2014-10-31 MED ORDER — ONDANSETRON HCL 4 MG/2ML IJ SOLN
INTRAMUSCULAR | Status: AC
  Filled 2014-10-31: qty 2

## 2014-10-31 MED ORDER — ONDANSETRON HCL 4 MG/2ML IJ SOLN
4.0000 mg | Freq: Once | INTRAMUSCULAR | Status: AC
  Administered 2014-10-31: 4 mg via INTRAVENOUS

## 2014-10-31 MED ORDER — LORAZEPAM 2 MG/ML IJ SOLN
1.0000 mg | Freq: Once | INTRAMUSCULAR | Status: DC
  Administered 2014-10-31: 1 mg via INTRAVENOUS

## 2014-10-31 MED ORDER — SODIUM CHLORIDE 0.9 % IV SOLN
INTRAVENOUS | Status: DC
  Administered 2014-11-01: 1 [IU]/h via INTRAVENOUS
  Filled 2014-10-31: qty 2.5

## 2014-10-31 MED ORDER — ONDANSETRON HCL 4 MG PO TABS: 4.0000 mg | ORAL_TABLET | Freq: Every day | ORAL | Status: DC | PRN

## 2014-10-31 MED ORDER — INSULIN ASPART 100 UNIT/ML ~~LOC~~ SOLN
SUBCUTANEOUS | Status: AC
  Administered 2014-10-31: 8 [IU] via INTRAVENOUS
  Filled 2014-10-31: qty 8

## 2014-10-31 MED ORDER — ONDANSETRON HCL 4 MG/2ML IJ SOLN
4.0000 mg | Freq: Once | INTRAMUSCULAR | Status: AC
  Administered 2014-10-31: 8 mg via INTRAVENOUS

## 2014-10-31 MED ORDER — LORAZEPAM 2 MG/ML IJ SOLN: 1.0000 mg | Freq: Once | INTRAMUSCULAR | Status: AC

## 2014-10-31 NOTE — H&P (Signed)
[facility] NAME: Richard Bray    MR#:  [id number]  DATE OF BIRTH:  08-08-1980  DATE OF ADMISSION:  10/31/2014  PRIMARY CARE PHYSICIAN: Lucilla Lame, MD   REQUESTING/REFERRING PHYSICIAN: Maximino Greenland  CHIEF COMPLAINT:   Chief Complaint  Patient presents with  . Abdominal Pain   nausea and vomiting Elevated blood sugars  HISTORY OF PRESENT ILLNESS:  Richard Bray  is a 34 y.o. male with a known hx of type 1 diabetes mellitus presents to the emergency room with the complaints of nausea vomiting with associated abdominal pain intermittently for the past 2 days. This has been his third visit to the ED in the last 2 days with the same complaints. Denies any fever, chills, chest pain, shortness of breath, cough, dysuria. In the emergency room patient was evaluated by the ED physician and noted to be with stable vital signs and afebrile. Workup revealed blood sugars of 217, serum bicarbonate of 15, anion gap of 19, PH on VBG 7.22. WBC elevated at 19.9. Chest x-ray negative for any acute cardiopulmonary process. EKG normal sinus rhythm with ventricular rate of 85 bpm, no acute ST-T changes. Patient was given IV fluids normal saline and started on insulin drip and hospitalist service was consulted for further management. Patient also received IV Zofran and currently abdominal symptoms are under control and states his nausea and vomiting and abdominal pain are under control.  PAST MEDICAL HISTORY:   Past Medical History  Diagnosis Date  . Type 1 diabetes mellitus     Diagnosed at 34 yo with DKA / ICU admission  . Diabetes mellitus     PAST SURGICAL HISTORY:  History reviewed. No pertinent past surgical history.  SOCIAL HISTORY:   History  Substance Use Topics  . Smoking status: Never Smoker   . Smokeless tobacco: Never Used  . Alcohol Use: No    FAMILY HISTORY:   Family History  Problem Relation Age of Onset  . Sickle  cell anemia Father   . Aneurysm Mother     Cerebral     DRUG ALLERGIES:   Allergies  Allergen Reactions  . Metformin Other (See Comments)    GI Upset    REVIEW OF SYSTEMS:   Review of Systems  Constitutional: Positive for malaise/fatigue. Negative for fever and chills.  HENT: Negative for ear pain, hearing loss, nosebleeds, sore throat and tinnitus.   Eyes: Negative for blurred vision, double vision, pain, discharge and redness.  Respiratory: Negative for cough, hemoptysis, sputum production, shortness of breath and wheezing.   Cardiovascular: Negative for chest pain, palpitations, orthopnea and leg swelling.  Gastrointestinal: Positive for nausea, vomiting and abdominal pain. Negative for diarrhea, constipation, blood in stool and melena.       As noted in history of present illness.  Genitourinary: Negative for dysuria, urgency, frequency and hematuria.  Musculoskeletal: Negative for back pain, joint pain and neck pain.  Skin: Negative for itching and rash.  Neurological: Negative for dizziness, tingling, sensory change, focal weakness and seizures.  Endo/Heme/Allergies: Does not bruise/bleed easily.  Psychiatric/Behavioral: Negative for depression. The patient is not nervous/anxious.     MEDICATIONS AT HOME:   Prior to Admission medications   Medication Sig Start Date End Date Taking? Authorizing Provider  acetone, urine, test strip 1 strip by Does not apply route as needed. Patient not taking: Reported on 10/31/2014 05/30/11   Cherene Altes, MD  HUMALOG KWIKPEN 100 UNIT/ML KiwkPen Inject 1-15  Units into the skin 3 (three) times daily. Sliding scale 09/22/14   Historical Provider, MD  insulin aspart (NOVOLOG) 100 UNIT/ML injection Inject 2-10 Units into the skin 3 (three) times daily before meals.      Historical Provider, MD  insulin aspart (NOVOLOG) 100 UNIT/ML injection Inject 4 Units into the skin 3 (three) times daily with meals. 05/30/11 05/29/12  Cherene Altes, MD   insulin glargine (LANTUS) 100 UNIT/ML injection Inject 17 Units into the skin 2 (two) times daily. 05/30/11 05/29/12  Cherene Altes, MD  LEVEMIR FLEXTOUCH 100 UNIT/ML Pen 24 Units at bedtime. 08/09/14   Historical Provider, MD  lisinopril (PRINIVIL,ZESTRIL) 20 MG tablet Take 20 mg by mouth daily.      Historical Provider, MD  ondansetron (ZOFRAN) 4 MG tablet Take 1 tablet (4 mg total) by mouth daily as needed for nausea or vomiting. 10/31/14   Orbie Pyo, MD      VITAL SIGNS:  Blood pressure 120/91, pulse 85, temperature 98.4 F (36.9 C), temperature source Oral, resp. rate 18, height 6' (1.829 m), weight 74.844 kg (165 lb), SpO2 100 %.  PHYSICAL EXAMINATION:  Physical Exam  Constitutional: He is oriented to person, place, and time. He appears well-developed and well-nourished. No distress.  HENT:  Head: Normocephalic and atraumatic.  Right Ear: External ear normal.  Left Ear: External ear normal.  Nose: Nose normal.  Mouth/Throat: Oropharynx is clear and moist. No oropharyngeal exudate.  Eyes: EOM are normal. Pupils are equal, round, and reactive to light. No scleral icterus.  Neck: Normal range of motion. Neck supple. No JVD present. No thyromegaly present.  Cardiovascular: Normal rate, regular rhythm, normal heart sounds and intact distal pulses.  Exam reveals no friction rub.   No murmur heard. Respiratory: Effort normal and breath sounds normal. No respiratory distress. He has no wheezes. He has no rales. He exhibits no tenderness.  GI: Soft. Bowel sounds are normal. He exhibits no distension and no mass. There is no tenderness. There is no rebound and no guarding.  Musculoskeletal: Normal range of motion. He exhibits no edema.  Lymphadenopathy:    He has no cervical adenopathy.  Neurological: He is alert and oriented to person, place, and time. He has normal reflexes. He displays normal reflexes. No cranial nerve deficit. He exhibits normal muscle tone.  Skin: Skin  is warm. No rash noted. No erythema.  Psychiatric: He has a normal mood and affect. His behavior is normal. Thought content normal.   LABORATORY PANEL:   CBC  Recent Labs Lab 10/31/14 2124  WBC 19.9*  HGB 12.7*  HCT 39.4*  PLT 230   ------------------------------------------------------------------------------------------------------------------  Chemistries   Recent Labs Lab 10/30/14 2113  10/31/14 2124  NA 140  < > 138  K 3.7  < > 3.7  CL 101  < > 104  CO2 19*  < > 15*  GLUCOSE 352*  < > 217*  BUN 19  < > 10  CREATININE 1.31*  < > 1.19  CALCIUM 9.8  < > 8.9  MG 2.2  --   --   AST 48*  --  31  ALT 40  --  31  ALKPHOS 175*  --  157*  BILITOT 1.1  --  1.0  < > = values in this interval not displayed. ------------------------------------------------------------------------------------------------------------------  Cardiac Enzymes No results for input(s): TROPONINI in the last 168 hours. ------------------------------------------------------------------------------------------------------------------  RADIOLOGY:  Dg Chest 1 View  10/31/2014   CLINICAL DATA:  Acute  onset of generalized abdominal pain, nausea and vomiting. Initial encounter.  EXAM: CHEST  1 VIEW  COMPARISON:  Chest radiograph performed 05/27/2011  FINDINGS: The lungs are well-aerated and clear. There is no evidence of focal opacification, pleural effusion or pneumothorax.  The cardiomediastinal silhouette is within normal limits. No acute osseous abnormalities are seen.  IMPRESSION: No acute cardiopulmonary process seen.   Electronically Signed   By: Garald Balding M.D.   On: 10/31/2014 21:45    EKG:   Orders placed or performed during the hospital encounter of 10/31/14  . ED EKG  . ED EKG  . EKG 12-Lead  . EKG 12-Lead normal sinus rhythm with ventricular rate of 85 bpm, no acute ST-T changes.    IMPRESSION AND PLAN:   1. DKA secondary to irregularity in usage of insulin at home because of  recent  shift change at work. 2. Diabetes mellitus type 1, on Lantus and SSI. Currently in DKA. 3. Leukocytosis, likely stress-related. Doubt infection since patient was not sick prior to the symptoms.  Plan: Admit to CCU, nothing by mouth,IV hydration, continue insulin drip, monitor every hourly blood sugars, BMP every 4 hours. Endocrinology consultation for further advice. -IV Zofran when necessary for nausea vomiting. -Blood cultures obtained, monitor temperature curve, follow-up CBC.    All the records are reviewed and case discussed with ED provider. Management plans discussed with the patient, family and they are in agreement.  CODE STATUS: full code  TOTAL TIME TAKING CARE OF THIS PATIENT: 50 minutes.    Juluis Mire M.D on 10/31/2014 at 11:48 PM  Between 7am to 6pm - Pager - [phone]  After 6pm go to [email or web address] - password [email or web address]  Office  [phone]  CC: Primary care physician; Lucilla Lame, MD

## 2014-10-31 NOTE — ED Notes (Signed)
Pt arrived to the ED accompanied by his wife for complaints of abdominal pain N/V. Pt is a type 1 diabetic and states that he feels like he is going into DKA. Pt reports being seen in this ED yesterday and today for the same without relief. Pt is AOx4 in no apparent distress.

## 2014-10-31 NOTE — ED Provider Notes (Signed)
[facility] Emergency Department Provider Note  ____________________________________________  Time seen: On arrival  I have reviewed the triage vital signs and the nursing notes.   HISTORY  Chief Complaint Abdominal Pain    HPI Richard Bray is a 34 y.o. male presents with dominant all cramping that is moderate, nausea and vomiting. This is patient's third visit to the ED in 24 hours. He has a history of type 1 diabetes and he believes that he is going into DKA. Prior to these visits he had not had a problem with his sugars for approximately one year. He recently switched his schedule to work overnight. He denies fevers chills. He denies dysuria. He has mild cough. He has an appointment with his endocrinologist tomorrow morning.     Past Medical History  Diagnosis Date  . Type 1 diabetes mellitus     Diagnosed at 34 yo with DKA / ICU admission  . Diabetes mellitus     Patient Active Problem List   Diagnosis Date Noted  . Type 1 diabetes mellitus     History reviewed. No pertinent past surgical history.  Current Outpatient Rx  Name  Route  Sig  Dispense  Refill  . acetone, urine, test strip   Does not apply   1 strip by Does not apply route as needed. Patient not taking: Reported on 10/31/2014   25 each   0   . HUMALOG KWIKPEN 100 UNIT/ML KiwkPen   Subcutaneous   Inject 1-15 Units into the skin 3 (three) times daily. Sliding scale           Dispense as written.   . insulin aspart (NOVOLOG) 100 UNIT/ML injection   Subcutaneous   Inject 2-10 Units into the skin 3 (three) times daily before meals.           Marland Kitchen EXPIRED: insulin aspart (NOVOLOG) 100 UNIT/ML injection   Subcutaneous   Inject 4 Units into the skin 3 (three) times daily with meals.   1 pen   prn   . EXPIRED: insulin glargine (LANTUS) 100 UNIT/ML injection   Subcutaneous   Inject 17 Units into the skin 2 (two) times daily.   3 mL   prn     lantus insulin pen    .  LEVEMIR FLEXTOUCH 100 UNIT/ML Pen      24 Units at bedtime.           Dispense as written.   Marland Kitchen lisinopril (PRINIVIL,ZESTRIL) 20 MG tablet   Oral   Take 20 mg by mouth daily.           . ondansetron (ZOFRAN) 4 MG tablet   Oral   Take 1 tablet (4 mg total) by mouth daily as needed for nausea or vomiting.   10 tablet   1     Allergies Metformin  Family History  Problem Relation Age of Onset  . Sickle cell anemia Father   . Aneurysm Mother     Cerebral     Social History History  Substance Use Topics  . Smoking status: Never Smoker   . Smokeless tobacco: Never Used  . Alcohol Use: No    Review of Systems  Constitutional: Negative for fever. Eyes: Negative for visual changes. ENT: Negative for sore throat Cardiovascular: Negative for chest pain. Respiratory: Negative for shortness of breath. Gastrointestinal: Positive for abdominal pain, nausea and vomiting Genitourinary: Negative for dysuria. Musculoskeletal: Negative for back pain. Skin: Negative for rash. Neurological: Negative for headaches or  focal weakness   10-point ROS otherwise negative.  ____________________________________________   PHYSICAL EXAM:  VITAL SIGNS: ED Triage Vitals  Enc Vitals Group     BP 10/31/14 2030 120/66 mmHg     Pulse Rate 10/31/14 2030 96     Resp 10/31/14 2030 18     Temp 10/31/14 2030 98.4 F (36.9 C)     Temp Source 10/31/14 2030 Oral     SpO2 10/31/14 2030 96 %     Weight 10/31/14 2030 165 lb (74.844 kg)     Height 10/31/14 2030 6' (1.829 m)     Head Cir --      Peak Flow --      Pain Score 10/31/14 2031 8     Pain Loc --      Pain Edu? --      Excl. in [place]? --      Constitutional: Alert and oriented. Ill-appearing Eyes: Conjunctivae are normal. PERRL. ENT   Head: Normocephalic and atraumatic.   Nose: No rhinnorhea.   Mouth/Throat: Mucous membranes are moist. Cardiovascular: Normal rate, regular rhythm. Normal and symmetric distal pulses are  present in all extremities. No murmurs, rubs, or gallops. Respiratory: Positive tachypnea. Breath sounds are clear and equal bilaterally.  Gastrointestinal: Mild tenderness diffusely. No distention. There is no CVA tenderness. Genitourinary: deferred Musculoskeletal: Nontender with normal range of motion in all extremities. No lower extremity tenderness nor edema. Neurologic:  Normal speech and language. No gross focal neurologic deficits are appreciated. Skin:  Skin is warm, dry and intact. No rash noted. Psychiatric: Mood and affect are normal. Patient exhibits appropriate insight and judgment.  ____________________________________________    LABS (pertinent positives/negatives)  Labs Reviewed  BLOOD GAS, VENOUS - Abnormal; Notable for the following:    pH, Ven 7.22 (*)    pCO2, Ven 35 (*)    Bicarbonate 14.3 (*)    Acid-base deficit 12.5 (*)    All other components within normal limits  CBC  COMPREHENSIVE METABOLIC PANEL  LIPASE, BLOOD  URINALYSIS COMPLETEWITH MICROSCOPIC ([facility] ONLY)    ____________________________________________   EKG  ED ECG REPORT I, Lavonia Drafts, the attending physician, personally viewed and interpreted this ECG.  Date: 10/31/2014 EKG Time: 9:45 PM Rate: 85 Rhythm: normal sinus rhythm QRS Axis: normal Intervals: normal ST/T Wave abnormalities: normal Conduction Disutrbances: none Narrative Interpretation: unremarkable   ____________________________________________    RADIOLOGY  Chest x-ray reviewed by me, no acute distress  ____________________________________________   PROCEDURES  Procedure(s) performed: none  Critical Care performed: yes CRITICAL CARE Performed by: Lavonia Drafts   Total critical care time: 35  Critical care time was exclusive of separately billable procedures and treating other patients.  Critical care was necessary to treat or prevent imminent or life-threatening deterioration.  Critical care was  time spent personally by me on the following activities: development of treatment plan with patient and/or surrogate as well as nursing, discussions with consultants, evaluation of patient's response to treatment, examination of patient, obtaining history from patient or surrogate, ordering and performing treatments and interventions, ordering and review of laboratory studies, ordering and review of radiographic studies, pulse oximetry and re-evaluation of patient's condition.   ____________________________________________   INITIAL IMPRESSION / ASSESSMENT AND PLAN / ED COURSE  Pertinent labs & imaging results that were available during my care of the patient were reviewed by me and considered in my medical decision making (see chart for details).  Strongly suspect DKA based on patient's presentation. We will send a VBG, labs,  chest x-ray and give normal saline IV.  ----------------------------------------- 10:32 PM on 10/31/2014 -----------------------------------------  . Patient with positive anion gap and pH of 7.22 and says with diabetic ketoacidosis. We will admit to the hospitalist. Insulin drip ordered ____________________________________________   FINAL CLINICAL IMPRESSION(S) / ED DIAGNOSES  Final diagnoses:  Diabetic ketoacidosis without coma associated with type 1 diabetes mellitus     Lavonia Drafts, MD 10/31/14 2233

## 2014-10-31 NOTE — ED Provider Notes (Addendum)
[facility] Emergency Department Provider Note  ____________________________________________  Time seen: Approximately 10:20 AM  I have reviewed the triage vital signs and the nursing notes.   HISTORY  Chief Complaint Abdominal Pain and Hyperglycemia    HPI Richard Bray is a 34 y.o. male with a history of type 1 diabetes presents today with hypoglycemia for the second time in the past 24 hours. The patient says that he is recently over the past 2 weeks which to schedule to an overnight working schedule. He now works 12 AM to 8 AM. He says that this has altered his insulin schedule and now he is awake at the time that he should be fasting. He is on insulin sliding scale of the Humalog 1/15 before meals as well as Levemir at bedtime 24 units. He sees Dr. Sharrie Rothman for diabetes care and last was in DKA about 3 years ago. He denies eating excessive sugary foods and reports compliance with his insulin. He has also had associated cramping to the upper abdomen nausea and vomiting. No blood in the vomitus. He was given Zofran in triage and his pain and nausea are now relieved.   Past Medical History  Diagnosis Date  . Type 1 diabetes mellitus     Diagnosed at 34 yo with DKA / ICU admission  . Diabetes mellitus     Patient Active Problem List   Diagnosis Date Noted  . Type 1 diabetes mellitus     History reviewed. No pertinent past surgical history.  Current Outpatient Rx  Name  Route  Sig  Dispense  Refill  . HUMALOG KWIKPEN 100 UNIT/ML KiwkPen   Subcutaneous   Inject 1-15 Units into the skin 3 (three) times daily. Sliding scale           Dispense as written.   Marland Kitchen LEVEMIR FLEXTOUCH 100 UNIT/ML Pen      24 Units at bedtime.           Dispense as written.   Marland Kitchen acetone, urine, test strip   Does not apply   1 strip by Does not apply route as needed. Patient not taking: Reported on 10/31/2014   25 each   0   . insulin aspart (NOVOLOG) 100 UNIT/ML  injection   Subcutaneous   Inject 2-10 Units into the skin 3 (three) times daily before meals.           Marland Kitchen EXPIRED: insulin aspart (NOVOLOG) 100 UNIT/ML injection   Subcutaneous   Inject 4 Units into the skin 3 (three) times daily with meals.   1 pen   prn   . EXPIRED: insulin glargine (LANTUS) 100 UNIT/ML injection   Subcutaneous   Inject 17 Units into the skin 2 (two) times daily.   3 mL   prn     lantus insulin pen    . lisinopril (PRINIVIL,ZESTRIL) 20 MG tablet   Oral   Take 20 mg by mouth daily.             Allergies Metformin  Family History  Problem Relation Age of Onset  . Sickle cell anemia Father   . Aneurysm Mother     Cerebral     Social History History  Substance Use Topics  . Smoking status: Never Smoker   . Smokeless tobacco: Never Used  . Alcohol Use: No    Review of Systems Constitutional: No fever/chills Eyes: No visual changes. ENT: No sore throat. Cardiovascular: Denies chest pain. Respiratory: Denies shortness of breath.  Gastrointestinal:  No diarrhea.  No constipation. Genitourinary: Negative for dysuria. Musculoskeletal: Negative for back pain. Skin: Negative for rash. Neurological: Negative for headaches, focal weakness or numbness.  10-point ROS otherwise negative.  ____________________________________________   PHYSICAL EXAM:  VITAL SIGNS: ED Triage Vitals  Enc Vitals Group     BP 10/31/14 0857 133/72 mmHg     Pulse Rate 10/31/14 0857 99     Resp 10/31/14 0857 16     Temp 10/31/14 0857 98.4 F (36.9 C)     Temp Source 10/31/14 0857 Oral     SpO2 10/31/14 0857 100 %     Weight 10/31/14 0857 170 lb (77.111 kg)     Height 10/31/14 0857 6' (1.829 m)     Head Cir --      Peak Flow --      Pain Score 10/31/14 0858 8     Pain Loc --      Pain Edu? --      Excl. in [place]? --     Constitutional: Alert and oriented. Well appearing and in no acute distress. Eyes: Conjunctivae are normal. PERRL. EOMI. Head:  Atraumatic. Nose: No congestion/rhinnorhea. Mouth/Throat: Mucous membranes are moist.  Oropharynx non-erythematous. Neck: No stridor.   Cardiovascular: Normal rate, regular rhythm. Grossly normal heart sounds.  Good peripheral circulation. Respiratory: Normal respiratory effort.  No retractions. Lungs CTAB. Gastrointestinal: Soft and nontender. No distention. No abdominal bruits. No CVA tenderness. Musculoskeletal: No lower extremity tenderness nor edema.  No joint effusions. Neurologic:  Normal speech and language. No gross focal neurologic deficits are appreciated. Speech is normal. No gait instability. Skin:  Skin is warm, dry and intact. No rash noted. Psychiatric: Mood and affect are normal. Speech and behavior are normal.  ____________________________________________   LABS (all labs ordered are listed, but only abnormal results are displayed)  Labs Reviewed  BASIC METABOLIC PANEL - Abnormal; Notable for the following:    Chloride 100 (*)    CO2 19 (*)    Glucose, Bld 369 (*)    Creatinine, Ser 1.28 (*)    Anion gap 19 (*)    All other components within normal limits  URINALYSIS COMPLETEWITH MICROSCOPIC ([facility] ONLY) - Abnormal; Notable for the following:    Color, Urine STRAW (*)    APPearance CLEAR (*)    Glucose, UA >500 (*)    Ketones, ur 2+ (*)    All other components within normal limits  BLOOD GAS, ARTERIAL - Abnormal; Notable for the following:    pH, Arterial 7.31 (*)    Bicarbonate 17.6 (*)    Acid-base deficit 7.8 (*)    Allens test (pass/fail) POSITIVE (*)    All other components within normal limits  BASIC METABOLIC PANEL - Abnormal; Notable for the following:    CO2 21 (*)    Glucose, Bld 117 (*)    Calcium 8.3 (*)    All other components within normal limits  CBC WITH DIFFERENTIAL/PLATELET - Abnormal; Notable for the following:    WBC 17.1 (*)    RBC 4.07 (*)    Hemoglobin 11.2 (*)    HCT 34.4 (*)    Neutro Abs 14.2 (*)    All other components  within normal limits   ____________________________________________  EKG   ____________________________________________  RADIOLOGY   ____________________________________________   PROCEDURES   ____________________________________________   INITIAL IMPRESSION / ASSESSMENT AND PLAN / ED COURSE  Pertinent labs & imaging results that were available during my care of the patient  were reviewed by me and considered in my medical decision making (see chart for details).  ----------------------------------------- 1:43 PM on 10/31/2014 -----------------------------------------  Patient resting comfortably. Reassuring vital signs and labs improving with insulin and fluids. Discussed case with Dr. Gabriel Carina who said that she will schedule the patient for appointment in the office tomorrow to adjust his insulin. She says it is too complicated to change over the phone and will need to have an in-depth discussion where he needs to bring his glucometer to the office for further review of his glucose readings. The patient and his wife were counseled about the plan and that they should be receiving a phone call on the cell phone number given Dr. Gabriel Carina An appointment scheduled for tomorrow. The patient will be given a work note so he will not have to work overnight tonight. I discussed eating only low-carb foods such as's out of the patient is willing to comply. No nausea and vomiting throughout the visit after Zofran. We'll discharge home with Zofran. We'll continue on current insulin regimen for now.  Gap now normal. Patient feeling improved. ____________________________________________   FINAL CLINICAL IMPRESSION(S) / ED DIAGNOSES  Acute diabetic ketoacidosis which is resolved. Return visit.    Orbie Pyo, MD 10/31/14 1346  Patient aware that he must bring his glucometer with him to the visit.  Orbie Pyo, MD 10/31/14 [phone]

## 2014-10-31 NOTE — ED Notes (Signed)
Was here last night.  They got his sugar down and then he went home and started abd pain again with nausea.  sugare back up to 435 this am

## 2014-11-01 ENCOUNTER — Encounter: Payer: Self-pay | Admitting: Radiology

## 2014-11-01 ENCOUNTER — Inpatient Hospital Stay: Payer: No Typology Code available for payment source

## 2014-11-01 LAB — BASIC METABOLIC PANEL
ANION GAP: 10 (ref 5–15)
Anion gap: 18 — ABNORMAL HIGH (ref 5–15)
BUN: 7 mg/dL (ref 6–20)
BUN: 6 mg/dL (ref 6–20)
CALCIUM: 8.9 mg/dL (ref 8.9–10.3)
CHLORIDE: 107 mmol/L (ref 101–111)
CO2: 13 mmol/L — ABNORMAL LOW (ref 22–32)
CO2: 19 mmol/L — ABNORMAL LOW (ref 22–32)
Calcium: 8.9 mg/dL (ref 8.9–10.3)
Chloride: 107 mmol/L (ref 101–111)
Creatinine, Ser: 0.99 mg/dL (ref 0.61–1.24)
Creatinine, Ser: 0.91 mg/dL (ref 0.61–1.24)
GFR calc Af Amer: >60 mL/min (ref >60)
GFR calc non Af Amer: >60 mL/min (ref >60)
GFR calc non Af Amer: >60 mL/min (ref >60)
GLUCOSE: 151 mg/dL — AB (ref 65–99)
Glucose, Bld: 171 mg/dL — ABNORMAL HIGH (ref 65–99)
POTASSIUM: 4.2 mmol/L (ref 3.5–5.1)
Potassium: 4.2 mmol/L (ref 3.5–5.1)
SODIUM: 136 mmol/L (ref 135–145)
Sodium: 138 mmol/L (ref 135–145)

## 2014-11-01 LAB — GLUCOSE, CAPILLARY
GLUCOSE-CAPILLARY: 157 mg/dL — AB (ref 65–99)
GLUCOSE-CAPILLARY: 126 mg/dL — AB (ref 65–99)
GLUCOSE-CAPILLARY: 182 mg/dL — AB (ref 65–99)
GLUCOSE-CAPILLARY: 57 mg/dL — AB (ref 65–99)
Glucose-Capillary: 207 mg/dL — ABNORMAL HIGH (ref 65–99)
Glucose-Capillary: 187 mg/dL — ABNORMAL HIGH (ref 65–99)
Glucose-Capillary: 174 mg/dL — ABNORMAL HIGH (ref 65–99)
Glucose-Capillary: 144 mg/dL — ABNORMAL HIGH (ref 65–99)
Glucose-Capillary: 138 mg/dL — ABNORMAL HIGH (ref 65–99)
Glucose-Capillary: 154 mg/dL — ABNORMAL HIGH (ref 65–99)
Glucose-Capillary: 134 mg/dL — ABNORMAL HIGH (ref 65–99)
Glucose-Capillary: 53 mg/dL — ABNORMAL LOW (ref 65–99)
Glucose-Capillary: 70 mg/dL (ref 65–99)
Glucose-Capillary: 121 mg/dL — ABNORMAL HIGH (ref 65–99)

## 2014-11-01 LAB — URINALYSIS COMPLETE WITH MICROSCOPIC (ARMC ONLY)
Bacteria, UA: NONE SEEN
Bilirubin Urine: NEGATIVE
Leukocytes, UA: NEGATIVE
NITRITE: NEGATIVE
PH: 5 (ref 5.0–8.0)
Protein, ur: NEGATIVE mg/dL
SPECIFIC GRAVITY, URINE: 1.012 (ref 1.005–1.030)
Squamous Epithelial / LPF: NONE SEEN

## 2014-11-01 LAB — CBC
HCT: 36.7 % — ABNORMAL LOW (ref 40.0–52.0)
Hemoglobin: 11.8 g/dL — ABNORMAL LOW (ref 13.0–18.0)
MCH: 27.5 pg (ref 26.0–34.0)
MCHC: 32 g/dL (ref 32.0–36.0)
MCV: 85.9 fL (ref 80.0–100.0)
Platelets: 172 10*3/uL (ref 150–440)
RBC: 4.27 MIL/uL — ABNORMAL LOW (ref 4.40–5.90)
RDW: 13.3 % (ref 11.5–14.5)
WBC: 19.9 10*3/uL — ABNORMAL HIGH (ref 3.8–10.6)

## 2014-11-01 LAB — MAGNESIUM: MAGNESIUM: 1.7 mg/dL (ref 1.7–2.4)

## 2014-11-01 LAB — HEMOGLOBIN A1C: Hgb A1c MFr Bld: 9.4 % — ABNORMAL HIGH (ref 4.0–6.0)

## 2014-11-01 LAB — LIPASE, BLOOD: Lipase: 25 U/L (ref 22–51)

## 2014-11-01 MED ORDER — INSULIN ASPART 100 UNIT/ML ~~LOC~~ SOLN
3.0000 [IU] | Freq: Three times a day (TID) | SUBCUTANEOUS | Status: DC
  Administered 2014-11-02 – 2014-11-04 (×5): 3 [IU] via SUBCUTANEOUS
  Filled 2014-11-01 (×4): qty 3
  Filled 2014-11-01: qty 2

## 2014-11-01 MED ORDER — MORPHINE SULFATE 2 MG/ML IJ SOLN
2.0000 mg | INTRAMUSCULAR | Status: DC | PRN
  Administered 2014-11-01 – 2014-11-02 (×6): 2 mg via INTRAVENOUS
  Filled 2014-11-01 (×6): qty 1

## 2014-11-01 MED ORDER — SODIUM CHLORIDE 0.9 % IV SOLN
INTRAVENOUS | Status: DC
  Administered 2014-11-01 – 2014-11-03 (×4): via INTRAVENOUS

## 2014-11-01 MED ORDER — LISINOPRIL 20 MG PO TABS
20.0000 mg | ORAL_TABLET | Freq: Every day | ORAL | Status: DC
  Administered 2014-11-01 – 2014-11-04 (×3): 20 mg via ORAL
  Filled 2014-11-01 (×3): qty 1

## 2014-11-01 MED ORDER — IOHEXOL 300 MG/ML  SOLN
100.0000 mL | Freq: Once | INTRAMUSCULAR | Status: AC | PRN
  Administered 2014-11-01: 100 mL via INTRAVENOUS

## 2014-11-01 MED ORDER — DEXTROSE-NACL 5-0.45 % IV SOLN
INTRAVENOUS | Status: DC
  Administered 2014-11-01: 03:00:00 via INTRAVENOUS

## 2014-11-01 MED ORDER — INSULIN ASPART 100 UNIT/ML ~~LOC~~ SOLN
0.0000 [IU] | Freq: Three times a day (TID) | SUBCUTANEOUS | Status: DC
  Administered 2014-11-01: 1 [IU] via SUBCUTANEOUS
  Administered 2014-11-02: 7 [IU] via SUBCUTANEOUS
  Administered 2014-11-02: 13:00:00 9 [IU] via SUBCUTANEOUS
  Administered 2014-11-03 (×2): 3 [IU] via SUBCUTANEOUS
  Administered 2014-11-04: 12:00:00 5 [IU] via SUBCUTANEOUS
  Administered 2014-11-04: 08:00:00 7 [IU] via SUBCUTANEOUS
  Filled 2014-11-01: qty 5
  Filled 2014-11-01: qty 1
  Filled 2014-11-01 (×2): qty 3
  Filled 2014-11-01 (×2): qty 7

## 2014-11-01 MED ORDER — PROMETHAZINE HCL 25 MG/ML IJ SOLN
12.5000 mg | INTRAMUSCULAR | Status: DC | PRN
  Administered 2014-11-01 (×2): 12.5 mg via INTRAVENOUS
  Administered 2014-11-02: 25 mg via INTRAVENOUS
  Administered 2014-11-03: 12.5 mg via INTRAVENOUS
  Filled 2014-11-01 (×3): qty 1

## 2014-11-01 MED ORDER — ENOXAPARIN SODIUM 40 MG/0.4ML ~~LOC~~ SOLN
40.0000 mg | SUBCUTANEOUS | Status: DC
  Administered 2014-11-01 – 2014-11-03 (×3): 40 mg via SUBCUTANEOUS
  Filled 2014-11-01 (×3): qty 0.4

## 2014-11-01 MED ORDER — POTASSIUM CHLORIDE 10 MEQ/100ML IV SOLN
10.0000 meq | INTRAVENOUS | Status: AC
  Filled 2014-11-01 (×2): qty 100

## 2014-11-01 MED ORDER — LEVOFLOXACIN 250 MG PO TABS
250.0000 mg | ORAL_TABLET | Freq: Every day | ORAL | Status: DC
  Administered 2014-11-01: 250 mg via ORAL
  Filled 2014-11-01: qty 1

## 2014-11-01 MED ORDER — ONDANSETRON HCL 4 MG/2ML IJ SOLN
INTRAMUSCULAR | Status: AC
  Administered 2014-11-01: 03:00:00
  Filled 2014-11-01: qty 2

## 2014-11-01 MED ORDER — PANTOPRAZOLE SODIUM 40 MG PO TBEC
40.0000 mg | DELAYED_RELEASE_TABLET | Freq: Every day | ORAL | Status: DC
  Administered 2014-11-01 – 2014-11-04 (×3): 40 mg via ORAL
  Filled 2014-11-01 (×3): qty 1

## 2014-11-01 MED ORDER — ONDANSETRON HCL 4 MG/2ML IJ SOLN
4.0000 mg | Freq: Once | INTRAMUSCULAR | Status: AC
  Administered 2014-11-01: 4 mg via INTRAVENOUS

## 2014-11-01 MED ORDER — ONDANSETRON HCL 4 MG/2ML IJ SOLN
4.0000 mg | Freq: Four times a day (QID) | INTRAMUSCULAR | Status: DC | PRN
  Administered 2014-11-01 – 2014-11-03 (×6): 4 mg via INTRAVENOUS
  Filled 2014-11-01 (×5): qty 2

## 2014-11-01 MED ORDER — IOHEXOL 240 MG/ML SOLN
50.0000 mL | INTRAMUSCULAR | Status: AC
Start: 2014-11-01 — End: 2014-11-01
  Administered 2014-11-01 (×2): 50 mL via ORAL

## 2014-11-01 MED ORDER — SODIUM CHLORIDE 0.9 % IV SOLN: INTRAVENOUS | Status: DC

## 2014-11-01 MED ORDER — MORPHINE SULFATE 2 MG/ML IJ SOLN
2.0000 mg | Freq: Once | INTRAMUSCULAR | Status: AC
  Administered 2014-11-01: 2 mg via INTRAVENOUS

## 2014-11-01 MED ORDER — MORPHINE SULFATE 2 MG/ML IJ SOLN
INTRAMUSCULAR | Status: AC
  Administered 2014-11-01: 2 mg via INTRAVENOUS
  Filled 2014-11-01: qty 1

## 2014-11-01 MED ORDER — INSULIN DETEMIR 100 UNIT/ML ~~LOC~~ SOLN
20.0000 [IU] | SUBCUTANEOUS | Status: DC
  Administered 2014-11-01 – 2014-11-04 (×4): 20 [IU] via SUBCUTANEOUS
  Filled 2014-11-01 (×7): qty 0.2

## 2014-11-01 NOTE — Progress Notes (Signed)
Dr. Bridgett Larsson notified MRCP ordered for tomorrow, no NPO orders. MD ordered NPO after midnight. Two VS orders, VSS, MD stated okay to have vitals q shift.

## 2014-11-01 NOTE — Progress Notes (Signed)
Blood glucose resulted 53, 4 oz orange juice given. Will recheck in 15 minutes. Madlyn Frankel, RN

## 2014-11-01 NOTE — Progress Notes (Signed)
[facility] [place] NAME: Richard Bray    MR#:  [id number]  DATE OF BIRTH:  24-Mar-1981  SUBJECTIVE:  CHIEF COMPLAINT:   Chief Complaint  Patient presents with  . Abdominal Pain     nausea  REVIEW OF SYSTEMS:  CONSTITUTIONAL: No fever, fatigue or weakness.  EYES: No blurred or double vision.  EARS, NOSE, AND THROAT: No tinnitus or ear pain.  RESPIRATORY: No cough, shortness of breath, wheezing or hemoptysis.  CARDIOVASCULAR: No chest pain, orthopnea, edema.  GASTROINTESTINAL: positive nausea, vomiting, no diarrhea, positive abdominal pain.  GENITOURINARY: No dysuria, hematuria.  ENDOCRINE: No polyuria, nocturia,  HEMATOLOGY: No anemia, easy bruising or bleeding SKIN: No rash or lesion. MUSCULOSKELETAL: No joint pain or arthritis.   NEUROLOGIC: No tingling, numbness, weakness.  PSYCHIATRY: No anxiety or depression.   ROS  DRUG ALLERGIES:   Allergies  Allergen Reactions  . Metformin Other (See Comments)    GI Upset    VITALS:  Blood pressure 128/81, pulse 90, temperature 98.9 F (37.2 C), temperature source Oral, resp. rate 12, height 6' (1.829 m), weight 74.844 kg (165 lb), SpO2 100 %.  PHYSICAL EXAMINATION:  GENERAL:  34 y.o.-year-old patient lying in the bed with no acute distress.  EYES: Pupils equal, round, reactive to light and accommodation. No scleral icterus. Extraocular muscles intact.  HEENT: Head atraumatic, normocephalic. Oropharynx and nasopharynx clear.  NECK:  Supple, no jugular venous distention. No thyroid enlargement, no tenderness.  LUNGS: Normal breath sounds bilaterally, no wheezing, rales,rhonchi or crepitation. No use of accessory muscles of respiration.  CARDIOVASCULAR: S1, S2 normal. No murmurs, rubs, or gallops.  ABDOMEN: Soft, tender on right upper and lower quadrant and guarding present , nondistended. Bowel sounds present. No organomegaly or mass.  EXTREMITIES: No pedal edema, cyanosis,  or clubbing.  NEUROLOGIC: Cranial nerves II through XII are intact. Muscle strength 5/5 in all extremities. Sensation intact. Gait not checked.  PSYCHIATRIC: The patient is alert and oriented x 3.  SKIN: No obvious rash, lesion, or ulcer.   Physical Exam LABORATORY PANEL:   CBC  Recent Labs Lab 11/01/14 0439  WBC 19.9*  HGB 11.8*  HCT 36.7*  PLT 172   ------------------------------------------------------------------------------------------------------------------  Chemistries   Recent Labs Lab 10/31/14 2124  11/01/14 0439 11/01/14 0650  NA 138  < >  --  136  K 3.7  < >  --  4.2  CL 104  < >  --  107  CO2 15*  < >  --  19*  GLUCOSE 217*  < >  --  151*  BUN 10  < >  --  6  CREATININE 1.19  < >  --  0.91  CALCIUM 8.9  < >  --  8.9  MG  --   --  1.7  --   AST 31  --   --   --   ALT 31  --   --   --   ALKPHOS 157*  --   --   --   BILITOT 1.0  --   --   --   < > = values in this interval not displayed. ------------------------------------------------------------------------------------------------------------------  Cardiac Enzymes No results for input(s): TROPONINI in the last 168 hours. ------------------------------------------------------------------------------------------------------------------  RADIOLOGY:  Dg Chest 1 View  10/31/2014   CLINICAL DATA:  Acute onset of generalized abdominal pain, nausea and vomiting. Initial encounter.  EXAM: CHEST  1 VIEW  COMPARISON:  Chest radiograph performed  05/27/2011  FINDINGS: The lungs are well-aerated and clear. There is no evidence of focal opacification, pleural effusion or pneumothorax.  The cardiomediastinal silhouette is within normal limits. No acute osseous abnormalities are seen.  IMPRESSION: No acute cardiopulmonary process seen.   Electronically Signed   By: Garald Balding M.D.   On: 10/31/2014 21:45     ASSESSMENT AND PLAN:   * DKA   Resolved now with Insulin drip,     Will transit to basal and bolus  insulin.    Consult Dr. Gabriel Carina as he have fluctuating schedule including night shift, to manage his timing of insulin.  * Abdominal pain   And nausea    Persist tenderness and guarding    Will do CT abdomen.    Meanwhile symptomatic management.  * leukocytosis   UA negative, will get CT abdomen.   No fever, no need for Abx for now- it may also be due to DKA.    All the records are reviewed and case discussed with Care Management/Social Workerr. Management plans discussed with the patient, family and they are in agreement.  CODE STATUS: full  TOTAL TIME TAKING CARE OF THIS PATIENT: 35 minutes.   POSSIBLE D/C IN 1-2 DAYS, DEPENDING ON CLINICAL CONDITION.   Vaughan Basta M.D on 11/01/2014   Between 7am to 6pm - Pager - [phone]  After 6pm go to [email or web address] - password [email or web address] [facility]  Office  [phone]  CC: Primary care physician; Lucilla Lame, MD

## 2014-11-01 NOTE — ED Notes (Signed)
CBG at 0157am= 157 mg/dL

## 2014-11-01 NOTE — Plan of Care (Signed)
Problem: Discharge Progression Outcomes Goal: Other Discharge Outcomes/Goals Plan of care progress to goal:  Patient complaining of abdominal pain - morphine given, relief noted. NS infusing at 125 ml/hr as ordered. No complaints of nausea/vomiting. Blood glucose low - orange juice given, improved to 70.

## 2014-11-01 NOTE — Progress Notes (Signed)
Pt arrived form ED started on insulin drip. Will cont. To monitor

## 2014-11-01 NOTE — Consult Note (Signed)
Consultation Primary Care Physician:  Lucilla Lame, MD Primary Gastroenterologist:  Dr. Eddie Dibbles Oh/Kaelynn Igo Charlean Merl, MSN, FNP-BC               Impression / Plan:   1. Epigastric and Right Upper Abdominal Pain. Etiology unclear. Discussed with Dr. Candace Cruise. Will order a MRCP. Levaquin 250 mg by mouth daily given elevated WBCs.   2. Esophagitis: Protonix 40 mg by mouth daily. EGD maybe a consideration. Will discuss with Dr. Candace Cruise.   3. Liver lesions: Likely Hemangiomas. Possibly a MRI will be needed. To discuss with Dr. Candace Cruise.         HPI:   Richard Bray is a 34 y.o. male   Patient is being seen in consultation for evaluation of esophagitis at the request of Dr.  Vaughan Basta. He presented to the ER on October 31, 2014 with nausea, vomiting and upper abdominal pain. His glucose levels were elevated. Once normalized, he was discharge to home. Patient tells me he tried to eat but his nausea and vomiting returned. He returned on Tuesday, November 01, 2014 because he didn't feel any better. His nausea, vomiting and upper abdominal pain continued.  He was admitted to ICU with DKA which has resolved with Insulin drip.  He has seen been transferred to Medical floor.   His nausea has continued. No vomiting. Epigastric and right upper quadrant pain persist.  He denies any other problems except low back pain for which he has been taking OTC Ibuprofen 2 tablets daily at least 3-5 days a week over the last month. He denies any hematemesis, melena or bright red bleeding per rectum. Denies reflux. States his nausea, vomiting and abdominal pain started acutely about 2 days ago. He denies use of any other NSAIDs. Rarely drinks alcohol. Last time was over a year ago. He denies a history of reflux, esophagitis, gastric ulcers, constipation or diarrhea.   No family history of GI cancers. Both grandmothers positive for breast cancer.   Past Medical History  Diagnosis Date  . Type 1 diabetes mellitus     Diagnosed at 34 yo  with DKA / ICU admission  . Diabetes mellitus     History reviewed. No pertinent past surgical history.  Family History  Problem Relation Age of Onset  . Sickle cell anemia Father   . Aneurysm Mother     Cerebral      History  Substance Use Topics  . Smoking status: Never Smoker   . Smokeless tobacco: Never Used  . Alcohol Use: No    Prior to Admission medications   Medication Sig Start Date End Date Taking? Authorizing Provider  acetone, urine, test strip 1 strip by Does not apply route as needed. Patient not taking: Reported on 10/31/2014 05/30/11   Cherene Altes, MD  HUMALOG KWIKPEN 100 UNIT/ML KiwkPen Inject 1-15 Units into the skin 3 (three) times daily. Sliding scale 09/22/14   Historical Provider, MD  insulin aspart (NOVOLOG) 100 UNIT/ML injection Inject 2-10 Units into the skin 3 (three) times daily before meals.      Historical Provider, MD  insulin aspart (NOVOLOG) 100 UNIT/ML injection Inject 4 Units into the skin 3 (three) times daily with meals. 05/30/11 05/29/12  Cherene Altes, MD  insulin glargine (LANTUS) 100 UNIT/ML injection Inject 17 Units into the skin 2 (two) times daily. 05/30/11 05/29/12  Cherene Altes, MD  LEVEMIR FLEXTOUCH 100 UNIT/ML Pen 24 Units at bedtime. 08/09/14   Historical Provider, MD  lisinopril (PRINIVIL,ZESTRIL) 20  MG tablet Take 20 mg by mouth daily.      Historical Provider, MD  ondansetron (ZOFRAN) 4 MG tablet Take 1 tablet (4 mg total) by mouth daily as needed for nausea or vomiting. 10/31/14   Orbie Pyo, MD    Current Facility-Administered Medications  Medication Dose Route Frequency Provider Last Rate Last Dose  . 0.9 %  sodium chloride infusion   Intravenous Continuous Judi Cong, MD 125 mL/hr at 11/01/14 1330    . enoxaparin (LOVENOX) injection 40 mg  40 mg Subcutaneous Q24H Juluis Mire, MD      . insulin aspart (novoLOG) injection 0-9 Units  0-9 Units Subcutaneous TID WC Vaughan Basta, MD   1 Units at  11/01/14 1212  . insulin aspart (novoLOG) injection 3 Units  3 Units Subcutaneous TID WC Judi Cong, MD      . insulin detemir (LEVEMIR) injection 20 Units  20 Units Subcutaneous Q24H Vaughan Basta, MD   20 Units at 11/01/14 0946  . lisinopril (PRINIVIL,ZESTRIL) tablet 20 mg  20 mg Oral Daily Juluis Mire, MD   20 mg at 11/01/14 1211  . morphine 2 MG/ML injection 2 mg  2 mg Intravenous Q4H PRN Juluis Mire, MD   2 mg at 11/01/14 1347  . ondansetron (ZOFRAN) injection 4 mg  4 mg Intravenous Q6H PRN Juluis Mire, MD   4 mg at 11/01/14 1346  . promethazine (PHENERGAN) injection 12.5 mg  12.5 mg Intravenous Q4H PRN Juluis Mire, MD   12.5 mg at 11/01/14 1053    Allergies as of 10/31/2014 - Review Complete 10/31/2014  Allergen Reaction Noted  . Metformin Other (See Comments) 10/31/2014     Review of Systems:    This is positive for those things mentioned in the HPI, also positive for low back pain. All other review of systems are negative.       Physical Exam:  Vital signs in last 24 hours: Temp:  [98.4 F (36.9 C)-99 F (37.2 C)] 99 F (37.2 C) (06/15 1100) Pulse Rate:  [81-105] 83 (06/15 1400) Resp:  [8-18] 8 (06/15 1400) BP: (97-141)/(55-99) 136/85 mmHg (06/15 1400) SpO2:  [96 %-100 %] 98 % (06/15 1400) Weight:  [74.844 kg (165 lb)] 74.844 kg (165 lb) (06/14 2030) Last BM Date: 10/30/14 (monday night)  General:  Well-developed, well-nourished and in no acute distress Eyes:  anicteric. ENT:   Mouth and posterior pharynx free of lesions.  Neck:   supple w/o thyromegaly or mass.  Lungs: Clear to auscultation bilaterally. Heart:  S1S2, no rubs, murmurs, gallops. Abdomen:  soft, moderate epigastric tenderness on palpation and mild right upper quadrant tenderness on palpation. No rebound or guarding, no hepatosplenomegaly, hernia, or mass and BS+.  Rectal:            deferred Lymph:  no cervical or supraclavicular adenopathy. Extremities:   no  edema Skin   no rash. Neuro:  A&O x 3.  Psych:  appropriate mood and  Affect.   Data Reviewed:   LAB RESULTS:  Recent Labs  10/31/14 1219 10/31/14 2124 11/01/14 0439  WBC 17.1* 19.9* 19.9*  HGB 11.2* 12.7* 11.8*  HCT 34.4* 39.4* 36.7*  PLT 181 230 172   BMET  Recent Labs  10/31/14 2124 11/01/14 0225 11/01/14 0650  NA 138 138 136  K 3.7 4.2 4.2  CL 104 107 107  CO2 15* 13* 19*  GLUCOSE 217* 171* 151*  BUN 10 7 6   CREATININE  1.19 0.99 0.91  CALCIUM 8.9 8.9 8.9   LFT  Recent Labs  10/31/14 2124  PROT 7.5  ALBUMIN 4.8  AST 31  ALT 31  ALKPHOS 157*  BILITOT 1.0   PT/INR No results for input(s): LABPROT, INR in the last 72 hours.  STUDIES: Dg Chest 1 View  10/31/2014   CLINICAL DATA:  Acute onset of generalized abdominal pain, nausea and vomiting. Initial encounter.  EXAM: CHEST  1 VIEW  COMPARISON:  Chest radiograph performed 05/27/2011  FINDINGS: The lungs are well-aerated and clear. There is no evidence of focal opacification, pleural effusion or pneumothorax.  The cardiomediastinal silhouette is within normal limits. No acute osseous abnormalities are seen.  IMPRESSION: No acute cardiopulmonary process seen.   Electronically Signed   By: Garald Balding M.D.   On: 10/31/2014 21:45   Ct Abdomen Pelvis W Contrast  11/01/2014   CLINICAL DATA:  Abdominal pain  EXAM: CT ABDOMEN AND PELVIS WITH CONTRAST  TECHNIQUE: Multidetector CT imaging of the abdomen and pelvis was performed using the standard protocol following bolus administration of intravenous contrast.  CONTRAST:  149mL OMNIPAQUE IOHEXOL 300 MG/ML  SOLN  COMPARISON:  None.  FINDINGS: BODY WALL: No contributory findings.  LOWER CHEST: Mild fat haziness around the lower esophagus which does not appear thickened. No lower new pneumomediastinum.  ABDOMEN/PELVIS:  Liver: There are 3 liver lesions, the largest in segment 7 measuring 17 mm. The nodule is mainly hypo enhancing, but does have a peripheral enhancing  nodule. The other 2 lesions are seen in the right liver on image 11 and in segment 3 on image 20 both measuring approximately 1 cm and homogeneously hypervascular. No cirrhotic changes in the liver or chart indication of malignancy history.  Biliary: Unexpected density in the gallbladder fundus. No wall thickening or other inflammatory change.  Pancreas: Marked atrophy of the pancreas for age. No duct enlargement or inflammatory change.  Spleen: Unremarkable.  Adrenals: Unremarkable.  Kidneys and ureters: No hydronephrosis or stone.  Bladder: Unremarkable.  Reproductive: No pathologic findings.  Bowel: No obstruction. No appendicitis.  Retroperitoneum: No mass or adenopathy.  Peritoneum: Small non loculated pelvic fluid, unexpected for gender.  Vascular: No acute abnormality.  OSSEOUS: No acute abnormalities.  IMPRESSION: 1. Mild edema around the lower esophagus. Are there symptoms of esophagitis? 2. Pelvic fluid raises the possibility of intra-abdominal inflammation, although no acute intra-abdominal finding seen. 3. Cholelithiasis versus gallbladder sludge. 4. Three liver lesions, up to 17 mm in the upper right lobe. These likely reflect hemangiomas; liver protocol MRI could further characterize. 5. Atrophic pancreas.   Electronically Signed   By: Monte Fantasia M.D.   On: 11/01/2014 13:35     PREVIOUS ENDOSCOPIES:            None   Thank you for this consultation.     LOS: 1 day   @Carmelita Amparo  Charlean Merl, MSN, FNP-BC@   11/01/2014, 4:21 PM

## 2014-11-01 NOTE — Consult Note (Signed)
ENDOCRINOLOGY CONSULTATION  REFERRING PHYSICIAN:  Dr. Marthann Schiller CONSULTING PHYSICIAN:  A. Lavone Orn, MD.  CHIEF COMPLAINT:  Diabetic ketoacidosis  HISTORY OF PRESENT ILLNESS:  34 y.o. male with type 1 diabetes well know to me was admitted last evening for diabetic ketoacidosis. Admission preceded by 2 days of nausea and vomiting. Nausea vomiting and high blood sugars the day prior to admission, also prompted evaluation in ED. He was found to have mild DKAA and was give IVF and IV insulin and had clinical improvement and was released. However, his nausea began as soon as he returned home.  Patient seen in ICU. Has been on IVF and IV insulin, however as DKA resolved he was started this morning on Levemir 20 units daily. IVF now D5NS at 125 cc/hr. He reports nausea is resolved. He has a good appetite.   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 Hot wokring environment may have ed to dehydration.    Typical daily dose of insulin is Levemir 24 units qhs and Humalog, based on insulin to carb ratio of 1:13 and correction for high sugars of 1 unit per 28 mg/dl over a target of 130. However, he does not reliably count carbohydrates. He is missing his Humalog at times. Sometimes just corrects highs. Current Hgb A1c is 9.4%. He has no known complications from diabetes.  PAST MEDICAL HISTORY:  Past Medical History  Diagnosis Date  . Type 1 diabetes mellitus     Diagnosed at 34 yo with DKA / ICU admission    CURRENT MEDICATIONS:  . enoxaparin (LOVENOX) injection  40 mg Subcutaneous Q24H  . insulin aspart  0-9 Units Subcutaneous TID WC        . insulin detemir  20 Units Subcutaneous Q24H  . levofloxacin  250 mg Oral Daily  . lisinopril  20 mg Oral Daily  . pantoprazole  40 mg Oral Daily    SOCIAL HISTORY:  Married. Employed. No tobacco use.  FAMILY HISTORY:   Family History  Problem Relation Age of Onset   . Sickle cell anemia Father   . Aneurysm Mother     Cerebral      ALLERGIES:  Allergies  Allergen Reactions  . Metformin Other (See Comments)    GI Upset    REVIEW OF SYSTEMS:  GENERAL:  No fever. No chills. HEENT:  No blurred vision. No sore throat.  NECK:  No neck pain or dysphagia.  CARDIAC:  No chest pain or palpitation.  PULMONARY:  No cough or shortness of breath.  ABDOMEN:  No weight loss.  No constipation. EXTREMITIES:  No lower extremity swelling.  No myalgias. ENDOCRINE:  No heat or cold intolerance.  GENITOURINARY:  No dysuria or hematuria. SKIN:  No recent rash or skin changes.   PHYSICAL EXAMINATION:  BP 108/72 mmHg  Pulse 82  Temp(Src) 99.4 F (37.4 C) (Oral)  Resp 8  Ht 6' (1.829 m)  Wt 74.844 kg (165 lb)  BMI 22.37 kg/m2  SpO2 100%  GENERAL:  Well-developed male in NAD. HEENT:  EOMI.  Oropharynx is clear.  NECK:  Supple.  No thyromegaly.  No neck tenderness.  CARDIAC:  Regular rate and rhythm without murmur.  PULMONARY:  Clear to auscultation bilaterally. No wheeze. ABDOMEN:  Diffusely soft, nontender, nondistended.  EXTREMITIES:  No peripheral edema is present.    SKIN:  No rash or dermatopathy. NEUROLOGIC:  No dysarthria.  No tremor.  PSYCHIATRIC:  Alert and oriented, calm, cooperative.   Labs Lab Results  Component Value Date   WBC 19.9* 11/01/2014   HGB 11.8* 11/01/2014   HCT 36.7* 11/01/2014   PLT 172 11/01/2014   GLUCOSE 151* 11/01/2014   ALT 31 10/31/2014   AST 31 10/31/2014   NA 136 11/01/2014   K 4.2 11/01/2014   CL 107 11/01/2014   CREATININE 0.91 11/01/2014   BUN 6 11/01/2014   CO2 19* 11/01/2014   HGBA1C 9.4* 11/01/2014    ASSESSMENT:  1. DKA, resolved. 2.   Type 1 diabetes mellitus, uncontrolled  PLAN: 1. Cause of DKA not clear, but possibly due to dehydration. He was reminded of the need to consistently check sugars qACHS and to take his Humalog as prescribed. We have been considering transitioning to an insulin  pump and we discussed this option again today. He was encouraged to meet with the pump representatives in the next 1-2 weeks if convenient. 2.  Agree with continuing basal-bolus insulin. Stop D5NS and replace with 0.9% NS. 3.  Continue daily Levemir 20 units. 4.  Add NovoLog 3 units for meals. May need more if tol PO and able to demonstrate good intake.  I am not available to follow up tomorrow however will return on 4/17 and continue to see him at that time. Will also be available to arrange out-patient follow up once he is ready for hospital discharge.

## 2014-11-01 NOTE — Plan of Care (Signed)
Problem: Phase I Progression Outcomes Goal: CBGs steadily decreasing on IV insulin drip Outcome: Progressing Insulin gtt dc'd at 1100 Goal: Monitor hydration status Outcome: Progressing NS @125 /hr. Voids 677m at a time. Nausea and vomitting and abdominl pain prevents po intake Goal: Acidosis resolving Outcome: Progressing  anion gap closed Goal: K+ level approaching normal with therapy Outcome: Progressing K+=WNL  Goal: Nausea/vomiting controlled with antiemetics Outcome: Progressing zofran and phenergan Goal: Pain controlled with appropriate interventions Outcome: Progressing Morphine given prn with some improvement Goal: OOB as tolerated unless otherwise ordered Outcome: Progressing Stands to void independently Goal: Initial discharge plan identified Outcome: Progressing Plans to return home with wife Goal: Diabetes Coordinator Consult Outcome: Completed/Met Date Met:  11/01/14 Dr SGabriel Carinain to see

## 2014-11-02 ENCOUNTER — Encounter
Admission: EM | Disposition: A | Discharge status: Home / Self Care | Payer: Self-pay | Source: Home / Self Care | Attending: Surgery

## 2014-11-02 ENCOUNTER — Inpatient Hospital Stay: Payer: No Typology Code available for payment source | Admitting: Certified Registered"

## 2014-11-02 ENCOUNTER — Encounter: Payer: Self-pay | Admitting: Gastroenterology

## 2014-11-02 ENCOUNTER — Inpatient Hospital Stay: Payer: No Typology Code available for payment source

## 2014-11-02 HISTORY — PX: ESOPHAGOGASTRODUODENOSCOPY: SHX5428

## 2014-11-02 LAB — COMPREHENSIVE METABOLIC PANEL
ALT: 26 U/L (ref 17–63)
AST: 30 U/L (ref 15–41)
Albumin: 4 g/dL (ref 3.5–5.0)
Alkaline Phosphatase: 134 U/L — ABNORMAL HIGH (ref 38–126)
Anion gap: 13 (ref 5–15)
BILIRUBIN TOTAL: 1.7 mg/dL — AB (ref 0.3–1.2)
BUN: 7 mg/dL (ref 6–20)
CALCIUM: 8.5 mg/dL — AB (ref 8.9–10.3)
CO2: 19 mmol/L — ABNORMAL LOW (ref 22–32)
CREATININE: 1.04 mg/dL (ref 0.61–1.24)
Chloride: 103 mmol/L (ref 101–111)
GFR calc Af Amer: >60 mL/min (ref >60)
GFR calc non Af Amer: >60 mL/min (ref >60)
Glucose, Bld: 363 mg/dL — ABNORMAL HIGH (ref 65–99)
POTASSIUM: 3.4 mmol/L — AB (ref 3.5–5.1)
Sodium: 135 mmol/L (ref 135–145)
Total Protein: 6.3 g/dL — ABNORMAL LOW (ref 6.5–8.1)

## 2014-11-02 LAB — GLUCOSE, CAPILLARY
GLUCOSE-CAPILLARY: 387 mg/dL — AB (ref 65–99)
GLUCOSE-CAPILLARY: 57 mg/dL — AB (ref 65–99)
GLUCOSE-CAPILLARY: 80 mg/dL (ref 65–99)
Glucose-Capillary: 90 mg/dL (ref 65–99)
Glucose-Capillary: 106 mg/dL — ABNORMAL HIGH (ref 65–99)
Glucose-Capillary: 153 mg/dL — ABNORMAL HIGH (ref 65–99)
Glucose-Capillary: 312 mg/dL — ABNORMAL HIGH (ref 65–99)

## 2014-11-02 LAB — BASIC METABOLIC PANEL
Anion gap: 5 (ref 5–15)
BUN: <5 mg/dL — ABNORMAL LOW (ref 6–20)
CO2: 21 mmol/L — AB (ref 22–32)
CREATININE: 0.87 mg/dL (ref 0.61–1.24)
Calcium: 8 mg/dL — ABNORMAL LOW (ref 8.9–10.3)
Chloride: 111 mmol/L (ref 101–111)
GLUCOSE: 101 mg/dL — AB (ref 65–99)
Potassium: 3.5 mmol/L (ref 3.5–5.1)
SODIUM: 137 mmol/L (ref 135–145)

## 2014-11-02 LAB — CBC
HCT: 38.1 % — ABNORMAL LOW (ref 40.0–52.0)
HEMOGLOBIN: 12.8 g/dL — AB (ref 13.0–18.0)
MCH: 28.2 pg (ref 26.0–34.0)
MCHC: 33.6 g/dL (ref 32.0–36.0)
MCV: 83.9 fL (ref 80.0–100.0)
Platelets: 164 10*3/uL (ref 150–440)
RBC: 4.53 MIL/uL (ref 4.40–5.90)
RDW: 13.5 % (ref 11.5–14.5)
WBC: 10.2 10*3/uL (ref 3.8–10.6)

## 2014-11-02 SURGERY — EGD (ESOPHAGOGASTRODUODENOSCOPY)
Anesthesia: General | Laterality: Left

## 2014-11-02 SURGERY — EGD (ESOPHAGOGASTRODUODENOSCOPY)
Anesthesia: Monitor Anesthesia Care | Laterality: Left

## 2014-11-02 MED ORDER — POTASSIUM CHLORIDE CRYS ER 20 MEQ PO TBCR
20.0000 meq | EXTENDED_RELEASE_TABLET | Freq: Two times a day (BID) | ORAL | Status: DC
  Administered 2014-11-03 – 2014-11-04 (×3): 20 meq via ORAL
  Filled 2014-11-02 (×4): qty 1

## 2014-11-02 MED ORDER — LEVOFLOXACIN 500 MG PO TABS
500.0000 mg | ORAL_TABLET | Freq: Every day | ORAL | Status: DC
  Administered 2014-11-03 – 2014-11-04 (×2): 500 mg via ORAL
  Filled 2014-11-02 (×2): qty 1

## 2014-11-02 MED ORDER — GADOBENATE DIMEGLUMINE 529 MG/ML IV SOLN
15.0000 mL | Freq: Once | INTRAVENOUS | Status: AC | PRN
  Administered 2014-11-02: 15 mL via INTRAVENOUS

## 2014-11-02 MED ORDER — MORPHINE SULFATE 4 MG/ML IJ SOLN
4.0000 mg | INTRAMUSCULAR | Status: DC | PRN
  Administered 2014-11-02 – 2014-11-04 (×7): 4 mg via INTRAVENOUS
  Filled 2014-11-02 (×7): qty 1

## 2014-11-02 MED ORDER — PROPOFOL 10 MG/ML IV BOLUS
INTRAVENOUS | Status: DC | PRN
  Administered 2014-11-02: 40 mg via INTRAVENOUS
  Administered 2014-11-02: 60 mg via INTRAVENOUS

## 2014-11-02 MED ORDER — SODIUM CHLORIDE 0.9 % IV SOLN
INTRAVENOUS | Status: DC
  Administered 2014-11-02: 12:00:00 via INTRAVENOUS
  Administered 2014-11-02: 12:00:00 1000 mL via INTRAVENOUS

## 2014-11-02 MED ORDER — MIDAZOLAM HCL 2 MG/2ML IJ SOLN
INTRAMUSCULAR | Status: DC | PRN
  Administered 2014-11-02: 2 mg via INTRAVENOUS

## 2014-11-02 MED ORDER — LIDOCAINE HCL (CARDIAC) 20 MG/ML IV SOLN
INTRAVENOUS | Status: DC | PRN
  Administered 2014-11-02: 50 mg via INTRAVENOUS

## 2014-11-02 MED ORDER — ONDANSETRON HCL 4 MG/2ML IJ SOLN
INTRAMUSCULAR | Status: DC | PRN
  Administered 2014-11-02: 4 mg via INTRAVENOUS

## 2014-11-02 NOTE — Plan of Care (Signed)
Problem: Discharge Progression Outcomes Goal: Pain controlled with appropriate interventions Outcome: Progressing Pt had a MRCP and a Upper Endoscopy, results showed gallstones. Pt had a lot of pain when he came back to floor, morphine given x1 with noted relief.

## 2014-11-02 NOTE — Consult Note (Signed)
Surgical Consultation  11/02/2014  Richard Bray is an 34 y.o. male.   CC: Epigastric pain  HPI: This a patient with 4 or 5 days of abdominal pain he points to his epigastrium it does not radiate through to his back he's had nausea and multiple emesis denies fevers or chills he prefers a lay in the left recumbent position lateral and his pain is ongoing and crampy in nature. He denies jaundice or acholic stools has never had an episode like this prior to the weekend. Denies melena or hematochezia. He's not sure of a family history of gallbladder disease.  Past Medical History  Diagnosis Date  . Type 1 diabetes mellitus     Diagnosed at 34 yo with DKA / ICU admission  . Diabetes mellitus     History reviewed. No pertinent past surgical history.  Family History  Problem Relation Age of Onset  . Sickle cell anemia Father   . Aneurysm Mother     Cerebral     Social History:  reports that he has never smoked. He has never used smokeless tobacco. He reports that he uses illicit drugs (Marijuana). He reports that he does not drink alcohol.  Allergies:  Allergies  Allergen Reactions  . Metformin Other (See Comments)    GI Upset    Medications reviewed.   Review of Systems:   Review of Systems  Constitutional: Negative for fever, chills, weight loss and diaphoresis.  HENT: Negative.   Eyes: Negative.   Respiratory: Negative.  Negative for cough and wheezing.   Cardiovascular: Negative.  Negative for chest pain and palpitations.  Gastrointestinal: Positive for nausea, vomiting and abdominal pain. Negative for heartburn, diarrhea, constipation, blood in stool and melena.  Genitourinary: Negative.   Musculoskeletal: Negative.   Skin: Negative.   Neurological: Negative.   Endo/Heme/Allergies: Negative.   Psychiatric/Behavioral: Negative.      Physical Exam:  BP 155/102 mmHg  Pulse 84  Temp(Src) 98.8 F (37.1 C) (Oral)  Resp 18  Ht 6' (1.829 m)  Wt 165 lb (74.844  kg)  BMI 22.37 kg/m2  SpO2 100%  Physical Exam  Constitutional: He is oriented to person, place, and time and well-developed, well-nourished, and in no distress.  Uncomfortable-appearing patient appears to prefer left lateral recumbent position  HENT:  Head: Normocephalic and atraumatic.  Eyes: No scleral icterus.  Neck: Normal range of motion. Neck supple.  Cardiovascular: Normal rate, regular rhythm and normal heart sounds.   No murmur heard. Pulmonary/Chest: Effort normal and breath sounds normal. No respiratory distress. He has no wheezes. He has no rales. He exhibits no tenderness.  Abdominal: Soft. He exhibits no distension and no mass. There is tenderness. There is no rebound and no guarding.  Maximal tenderness in right upper quadrant into a lesser degree right lower quadrant and epigastrium questionable Murphy sign  Musculoskeletal: Normal range of motion.  Neurological: He is alert and oriented to person, place, and time.  Skin: Skin is warm and dry. No erythema.  Psychiatric:  Does not open eyes to discuss history or plan lysed on his side with his eyes closed      Results for orders placed or performed during the hospital encounter of 10/31/14 (from the past 48 hour(s))  Blood gas, venous     Status: Abnormal   Collection Time: 10/31/14  9:21 PM  Result Value Ref Range   FIO2 ROOM AIR %   pH, Ven 7.22 (L) 7.320 - 7.430   pCO2, Ven 35 (L)  44.0 - 60.0 mmHg   Bicarbonate 14.3 (L) 21.0 - 28.0 mEq/L   Acid-base deficit 12.5 (H) 0.0 - 2.0 mmol/L   Patient temperature 37.0    Collection site VEIN    Sample type VEIN   CBC     Status: Abnormal   Collection Time: 10/31/14  9:24 PM  Result Value Ref Range   WBC 19.9 (H) 3.8 - 10.6 K/uL   RBC 4.62 4.40 - 5.90 MIL/uL   Hemoglobin 12.7 (L) 13.0 - 18.0 g/dL   HCT 39.4 (L) 40.0 - 52.0 %   MCV 85.3 80.0 - 100.0 fL   MCH 27.5 26.0 - 34.0 pg   MCHC 32.3 32.0 - 36.0 g/dL   RDW 13.2 11.5 - 14.5 %   Platelets 230 150 - 440 K/uL   Comprehensive metabolic panel     Status: Abnormal   Collection Time: 10/31/14  9:24 PM  Result Value Ref Range   Sodium 138 135 - 145 mmol/L   Potassium 3.7 3.5 - 5.1 mmol/L   Chloride 104 101 - 111 mmol/L   CO2 15 (L) 22 - 32 mmol/L   Glucose, Bld 217 (H) 65 - 99 mg/dL   BUN 10 6 - 20 mg/dL   Creatinine, Ser 1.19 0.61 - 1.24 mg/dL   Calcium 8.9 8.9 - 10.3 mg/dL   Total Protein 7.5 6.5 - 8.1 g/dL   Albumin 4.8 3.5 - 5.0 g/dL   AST 31 15 - 41 U/L   ALT 31 17 - 63 U/L   Alkaline Phosphatase 157 (H) 38 - 126 U/L   Total Bilirubin 1.0 0.3 - 1.2 mg/dL   GFR calc non Af Amer >60 >60 mL/min   GFR calc Af Amer >60 >60 mL/min    Comment: (NOTE) The eGFR has been calculated using the CKD EPI equation. This calculation has not been validated in all clinical situations. eGFR's persistently <60 mL/min signify possible Chronic Kidney Disease.    Anion gap 19 (H) 5 - 15  Lipase, blood     Status: None   Collection Time: 10/31/14  9:24 PM  Result Value Ref Range   Lipase 25 22 - 51 U/L  Urine culture     Status: None (Preliminary result)   Collection Time: 10/31/14 10:46 PM  Result Value Ref Range   Specimen Description URINE, CLEAN CATCH    Special Requests NONE    Culture NO GROWTH 1 DAY    Report Status PENDING   Urinalysis complete, with microscopic ([facility] only)     Status: Abnormal   Collection Time: 10/31/14 11:57 PM  Result Value Ref Range   Color, Urine STRAW (A) YELLOW   APPearance CLEAR (A) CLEAR   Glucose, UA >500 (A) NEGATIVE mg/dL   Bilirubin Urine NEGATIVE NEGATIVE   Ketones, ur 2+ (A) NEGATIVE mg/dL   Specific Gravity, Urine 1.012 1.005 - 1.030   Hgb urine dipstick 1+ (A) NEGATIVE   pH 5.0 5.0 - 8.0   Protein, ur NEGATIVE NEGATIVE mg/dL   Nitrite NEGATIVE NEGATIVE   Leukocytes, UA NEGATIVE NEGATIVE   RBC / HPF 0-5 0 - 5 RBC/hpf   WBC, UA 0-5 0 - 5 WBC/hpf   Bacteria, UA NONE SEEN NONE SEEN   Squamous Epithelial / LPF NONE SEEN NONE SEEN  Glucose, capillary      Status: Abnormal   Collection Time: 11/01/14  1:57 AM  Result Value Ref Range   Glucose-Capillary 157 (H) 65 - 99 mg/dL  Basic metabolic panel  Status: Abnormal   Collection Time: 11/01/14  2:25 AM  Result Value Ref Range   Sodium 138 135 - 145 mmol/L   Potassium 4.2 3.5 - 5.1 mmol/L   Chloride 107 101 - 111 mmol/L   CO2 13 (L) 22 - 32 mmol/L   Glucose, Bld 171 (H) 65 - 99 mg/dL   BUN 7 6 - 20 mg/dL   Creatinine, Ser 0.99 0.61 - 1.24 mg/dL   Calcium 8.9 8.9 - 10.3 mg/dL   GFR calc non Af Amer >60 >60 mL/min   GFR calc Af Amer >60 >60 mL/min    Comment: (NOTE) The eGFR has been calculated using the CKD EPI equation. This calculation has not been validated in all clinical situations. eGFR's persistently <60 mL/min signify possible Chronic Kidney Disease.    Anion gap 18 (H) 5 - 15  Glucose, capillary     Status: Abnormal   Collection Time: 11/01/14  3:29 AM  Result Value Ref Range   Glucose-Capillary 207 (H) 65 - 99 mg/dL   Comment 1 Notify RN   CBC     Status: Abnormal   Collection Time: 11/01/14  4:39 AM  Result Value Ref Range   WBC 19.9 (H) 3.8 - 10.6 K/uL   RBC 4.27 (L) 4.40 - 5.90 MIL/uL   Hemoglobin 11.8 (L) 13.0 - 18.0 g/dL   HCT 36.7 (L) 40.0 - 52.0 %   MCV 85.9 80.0 - 100.0 fL   MCH 27.5 26.0 - 34.0 pg   MCHC 32.0 32.0 - 36.0 g/dL   RDW 13.3 11.5 - 14.5 %   Platelets 172 150 - 440 K/uL  Magnesium     Status: None   Collection Time: 11/01/14  4:39 AM  Result Value Ref Range   Magnesium 1.7 1.7 - 2.4 mg/dL  Hemoglobin A1c     Status: Abnormal   Collection Time: 11/01/14  4:39 AM  Result Value Ref Range   Hgb A1c MFr Bld 9.4 (H) 4.0 - 6.0 %  Glucose, capillary     Status: Abnormal   Collection Time: 11/01/14  4:40 AM  Result Value Ref Range   Glucose-Capillary 187 (H) 65 - 99 mg/dL   Comment 1 Notify RN   Glucose, capillary     Status: Abnormal   Collection Time: 11/01/14  5:42 AM  Result Value Ref Range   Glucose-Capillary 174 (H) 65 - 99 mg/dL    Comment 1 Notify RN   Glucose, capillary     Status: Abnormal   Collection Time: 11/01/14  6:43 AM  Result Value Ref Range   Glucose-Capillary 144 (H) 65 - 99 mg/dL  Basic metabolic panel (stat then every 4 hours)     Status: Abnormal   Collection Time: 11/01/14  6:50 AM  Result Value Ref Range   Sodium 136 135 - 145 mmol/L   Potassium 4.2 3.5 - 5.1 mmol/L   Chloride 107 101 - 111 mmol/L   CO2 19 (L) 22 - 32 mmol/L   Glucose, Bld 151 (H) 65 - 99 mg/dL   BUN 6 6 - 20 mg/dL   Creatinine, Ser 0.91 0.61 - 1.24 mg/dL   Calcium 8.9 8.9 - 10.3 mg/dL   GFR calc non Af Amer >60 >60 mL/min   GFR calc Af Amer >60 >60 mL/min    Comment: (NOTE) The eGFR has been calculated using the CKD EPI equation. This calculation has not been validated in all clinical situations. eGFR's persistently <60 mL/min signify possible Chronic Kidney  Disease.    Anion gap 10 5 - 15  Culture, blood (routine x 2)     Status: None (Preliminary result)   Collection Time: 11/01/14  6:50 AM  Result Value Ref Range   Specimen Description BLOOD    Special Requests NONE    Culture NO GROWTH 1 DAY    Report Status PENDING   Lipase, blood     Status: None   Collection Time: 11/01/14  6:50 AM  Result Value Ref Range   Lipase 25 22 - 51 U/L  Glucose, capillary     Status: Abnormal   Collection Time: 11/01/14  7:39 AM  Result Value Ref Range   Glucose-Capillary 126 (H) 65 - 99 mg/dL  Glucose, capillary     Status: Abnormal   Collection Time: 11/01/14  8:41 AM  Result Value Ref Range   Glucose-Capillary 138 (H) 65 - 99 mg/dL  Glucose, capillary     Status: Abnormal   Collection Time: 11/01/14  9:39 AM  Result Value Ref Range   Glucose-Capillary 154 (H) 65 - 99 mg/dL  Glucose, capillary     Status: Abnormal   Collection Time: 11/01/14 10:34 AM  Result Value Ref Range   Glucose-Capillary 182 (H) 65 - 99 mg/dL  Glucose, capillary     Status: Abnormal   Collection Time: 11/01/14 12:05 PM  Result Value Ref Range    Glucose-Capillary 134 (H) 65 - 99 mg/dL  Glucose, capillary     Status: Abnormal   Collection Time: 11/01/14  4:38 PM  Result Value Ref Range   Glucose-Capillary 53 (L) 65 - 99 mg/dL  Glucose, capillary     Status: Abnormal   Collection Time: 11/01/14  5:08 PM  Result Value Ref Range   Glucose-Capillary 57 (L) 65 - 99 mg/dL  Glucose, capillary     Status: None   Collection Time: 11/01/14  5:40 PM  Result Value Ref Range   Glucose-Capillary 70 65 - 99 mg/dL  Glucose, capillary     Status: Abnormal   Collection Time: 11/01/14 10:04 PM  Result Value Ref Range   Glucose-Capillary 121 (H) 65 - 99 mg/dL   Comment 1 Notify RN   Glucose, capillary     Status: None   Collection Time: 11/02/14  2:46 AM  Result Value Ref Range   Glucose-Capillary 90 65 - 99 mg/dL   Comment 1 Notify RN   Basic metabolic panel     Status: Abnormal   Collection Time: 11/02/14  5:44 AM  Result Value Ref Range   Sodium 137 135 - 145 mmol/L   Potassium 3.5 3.5 - 5.1 mmol/L    Comment: HEMOLYSIS AT THIS LEVEL MAY AFFECT RESULT   Chloride 111 101 - 111 mmol/L   CO2 21 (L) 22 - 32 mmol/L   Glucose, Bld 101 (H) 65 - 99 mg/dL   BUN <5 (L) 6 - 20 mg/dL   Creatinine, Ser 0.87 0.61 - 1.24 mg/dL   Calcium 8.0 (L) 8.9 - 10.3 mg/dL   GFR calc non Af Amer >60 >60 mL/min   GFR calc Af Amer >60 >60 mL/min    Comment: (NOTE) The eGFR has been calculated using the CKD EPI equation. This calculation has not been validated in all clinical situations. eGFR's persistently <60 mL/min signify possible Chronic Kidney Disease.    Anion gap 5 5 - 15  Glucose, capillary     Status: Abnormal   Collection Time: 11/02/14  6:10 AM  Result Value Ref  Range   Glucose-Capillary 106 (H) 65 - 99 mg/dL   Comment 1 Notify RN   Glucose, capillary     Status: Abnormal   Collection Time: 11/02/14  7:14 AM  Result Value Ref Range   Glucose-Capillary 153 (H) 65 - 99 mg/dL  CBC     Status: Abnormal   Collection Time: 11/02/14  1:09 PM   Result Value Ref Range   WBC 10.2 3.8 - 10.6 K/uL   RBC 4.53 4.40 - 5.90 MIL/uL   Hemoglobin 12.8 (L) 13.0 - 18.0 g/dL   HCT 38.1 (L) 40.0 - 52.0 %   MCV 83.9 80.0 - 100.0 fL   MCH 28.2 26.0 - 34.0 pg   MCHC 33.6 32.0 - 36.0 g/dL   RDW 13.5 11.5 - 14.5 %   Platelets 164 150 - 440 K/uL  Comprehensive metabolic panel     Status: Abnormal   Collection Time: 11/02/14  1:09 PM  Result Value Ref Range   Sodium 135 135 - 145 mmol/L   Potassium 3.4 (L) 3.5 - 5.1 mmol/L   Chloride 103 101 - 111 mmol/L   CO2 19 (L) 22 - 32 mmol/L   Glucose, Bld 363 (H) 65 - 99 mg/dL   BUN 7 6 - 20 mg/dL   Creatinine, Ser 1.04 0.61 - 1.24 mg/dL   Calcium 8.5 (L) 8.9 - 10.3 mg/dL   Total Protein 6.3 (L) 6.5 - 8.1 g/dL   Albumin 4.0 3.5 - 5.0 g/dL   AST 30 15 - 41 U/L   ALT 26 17 - 63 U/L   Alkaline Phosphatase 134 (H) 38 - 126 U/L   Total Bilirubin 1.7 (H) 0.3 - 1.2 mg/dL   GFR calc non Af Amer >60 >60 mL/min   GFR calc Af Amer >60 >60 mL/min    Comment: (NOTE) The eGFR has been calculated using the CKD EPI equation. This calculation has not been validated in all clinical situations. eGFR's persistently <60 mL/min signify possible Chronic Kidney Disease.    Anion gap 13 5 - 15  Glucose, capillary     Status: Abnormal   Collection Time: 11/02/14  1:24 PM  Result Value Ref Range   Glucose-Capillary 387 (H) 65 - 99 mg/dL  Glucose, capillary     Status: Abnormal   Collection Time: 11/02/14  4:07 PM  Result Value Ref Range   Glucose-Capillary 312 (H) 65 - 99 mg/dL   Dg Chest 1 View  10/31/2014   CLINICAL DATA:  Acute onset of generalized abdominal pain, nausea and vomiting. Initial encounter.  EXAM: CHEST  1 VIEW  COMPARISON:  Chest radiograph performed 05/27/2011  FINDINGS: The lungs are well-aerated and clear. There is no evidence of focal opacification, pleural effusion or pneumothorax.  The cardiomediastinal silhouette is within normal limits. No acute osseous abnormalities are seen.  IMPRESSION:  No acute cardiopulmonary process seen.   Electronically Signed   By: Garald Balding M.D.   On: 10/31/2014 21:45   Ct Abdomen Pelvis W Contrast  11/01/2014   CLINICAL DATA:  Abdominal pain  EXAM: CT ABDOMEN AND PELVIS WITH CONTRAST  TECHNIQUE: Multidetector CT imaging of the abdomen and pelvis was performed using the standard protocol following bolus administration of intravenous contrast.  CONTRAST:  143m OMNIPAQUE IOHEXOL 300 MG/ML  SOLN  COMPARISON:  None.  FINDINGS: BODY WALL: No contributory findings.  LOWER CHEST: Mild fat haziness around the lower esophagus which does not appear thickened. No lower new pneumomediastinum.  ABDOMEN/PELVIS:  Liver: There  are 3 liver lesions, the largest in segment 7 measuring 17 mm. The nodule is mainly hypo enhancing, but does have a peripheral enhancing nodule. The other 2 lesions are seen in the right liver on image 11 and in segment 3 on image 20 both measuring approximately 1 cm and homogeneously hypervascular. No cirrhotic changes in the liver or chart indication of malignancy history.  Biliary: Unexpected density in the gallbladder fundus. No wall thickening or other inflammatory change.  Pancreas: Marked atrophy of the pancreas for age. No duct enlargement or inflammatory change.  Spleen: Unremarkable.  Adrenals: Unremarkable.  Kidneys and ureters: No hydronephrosis or stone.  Bladder: Unremarkable.  Reproductive: No pathologic findings.  Bowel: No obstruction. No appendicitis.  Retroperitoneum: No mass or adenopathy.  Peritoneum: Small non loculated pelvic fluid, unexpected for gender.  Vascular: No acute abnormality.  OSSEOUS: No acute abnormalities.  IMPRESSION: 1. Mild edema around the lower esophagus. Are there symptoms of esophagitis? 2. Pelvic fluid raises the possibility of intra-abdominal inflammation, although no acute intra-abdominal finding seen. 3. Cholelithiasis versus gallbladder sludge. 4. Three liver lesions, up to 17 mm in the upper right lobe. These  likely reflect hemangiomas; liver protocol MRI could further characterize. 5. Atrophic pancreas.   Electronically Signed   By: Monte Fantasia M.D.   On: 11/01/2014 13:35   Mr Lambert Mody Cm/mrcp  11/02/2014   CLINICAL DATA:  Abdominal pain. Nausea and vomiting. Symptoms started on 10/30/2014  EXAM: MRI ABDOMEN WITHOUT AND WITH CONTRAST (INCLUDING MRCP)  TECHNIQUE: Multiplanar multisequence MR imaging of the abdomen was performed both before and after the administration of intravenous contrast. Heavily T2-weighted images of the biliary and pancreatic ducts were obtained, and three-dimensional MRCP images were rendered by post processing.  CONTRAST:  75m MULTIHANCE GADOBENATE DIMEGLUMINE 529 MG/ML IV SOLN  COMPARISON:  11/01/2014  FINDINGS: Despite efforts by the technologist and patient, motion artifact is present on today's exam and could not be eliminated. This reduces exam sensitivity and specificity.  Lower chest:  Unremarkable  Hepatobiliary: Numerous gallstones fill the gallbladder. One of these posteriorly is larger, measuring 2.5 cm in long axis, or is most of the other gallstones are small.  No biliary dilatation or compelling findings of choledocholithiasis.  Unfortunately the top of the liver was excluded on all axial images.  Multiple hepatic hemangiomas are present. These include a 1.8 cm lesion posteriorly in segment 7 of the liver (image 40, series 14) and a 1.1 cm hemangioma in the left hepatic lobe on image 20 series 14. These lesions demonstrate delayed enhancement and high precontrast T2 signal intensity.  Pancreas: Unremarkable  Spleen: Unremarkable  Adrenals/Urinary Tract: Unremarkable  Stomach/Bowel: Unremarkable  Vascular/Lymphatic: Unremarkable  Other: Trace free fluid along the inferior margin of the right hepatic lobe.  Musculoskeletal: Unremarkable  IMPRESSION: 1. Numerous gallstones fill in the gallbladder. Most of these are small but there is a larger gallstone measuring 2.5 cm in long  axis diameter. 2. Multiple hepatic hemangiomas. 3. Incidental trace free fluid along the inferior margin of the right hepatic lobe.   Electronically Signed   By: WVan ClinesM.D.   On: 11/02/2014 10:00    Assessment/Plan:  This a patient with a proximally for 5 days of abdominal pain he points to the epigastrium but he is most tender in the right upper quadrant and slightly less so in the right lower quadrant. Studies are been personally reviewed including an MRCP and CT scan his LFTs are otherwise normal with the exception of  a slightly elevated bilirubin. Gallstones on MR and on CT scan. With these findings and his history and physical exam findings I suggest laparoscopic cholecystectomy for control of his symptoms. I discussed with him the options of observation and the risks of bleeding infection recurrence of symptoms failure to resolve his symptoms conversion to an open procedure bile duct damage bile duct leak retained common bile duct stone any of which could require further surgery and/or ERCP stent and papillotomy. I would also like to obtain a cholangiogram due to slightly elevated bilirubin. The rationale for this been discussed he understood and agreed to proceed I will make him nothing by mouth tonight and schedule for tomorrow.  Florene Glen, MD, FACS   .recc

## 2014-11-02 NOTE — Anesthesia Postprocedure Evaluation (Signed)
  Anesthesia Post-op Note  Patient: Richard Bray  Procedure(s) Performed: Procedure(s): ESOPHAGOGASTRODUODENOSCOPY (EGD) (Left)  Anesthesia type:General  Patient location: PACU  Post pain: Pain level controlled  Post assessment: Post-op Vital signs reviewed, Patient's Cardiovascular Status Stable, Respiratory Function Stable, Patent Airway and No signs of Nausea or vomiting  Post vital signs: Reviewed and stable  Last Vitals:  Filed Vitals:   11/02/14 1310  BP: 155/102  Pulse: 84  Temp: 37.1 C  Resp: 18    Level of consciousness: awake, alert  and patient cooperative  Complications: No apparent anesthesia complications

## 2014-11-02 NOTE — Consult Note (Signed)
  GI Inpatient Follow-up Note  Patient Identification: Richard Bray is a 34 y.o. male with n/v and abdominal pain. Please refer to D. Hassell Done NP's notes.  Subjective: Intractable nausea/vomiting despite having BG level controlled. Alk phos elevated on admission. WBC elevated. No prior hx of heartburn/GERD sxs. No prior GB hx.  CT showed 3 small liver lesions, GB sludge or stones.  Scheduled Inpatient Medications:  . enoxaparin (LOVENOX) injection  40 mg Subcutaneous Q24H  . insulin aspart  0-9 Units Subcutaneous TID WC  . insulin aspart  3 Units Subcutaneous TID WC  . insulin detemir  20 Units Subcutaneous Q24H  . levofloxacin  250 mg Oral Daily  . lisinopril  20 mg Oral Daily  . pantoprazole  40 mg Oral Daily  . potassium chloride  20 mEq Oral BID    Continuous Inpatient Infusions:   . sodium chloride 125 mL/hr at 11/02/14 0502    PRN Inpatient Medications:  morphine injection, ondansetron (ZOFRAN) IV, promethazine  Review of Systems: Constitutional: Weight is stable.  Eyes: No changes in vision. ENT: No oral lesions, sore throat.  GI: see HPI.  Heme/Lymph: No easy bruising.  CV: No chest pain.  GU: No hematuria.  Integumentary: No rashes.  Neuro: No headaches.  Psych: No depression/anxiety.  Endocrine: No heat/cold intolerance.  Allergic/Immunologic: No urticaria.  Resp: No cough, SOB.  Musculoskeletal: No joint swelling.    Physical Examination: BP 125/71 mmHg  Pulse 79  Temp(Src) 98.6 F (37 C) (Oral)  Resp 8  Ht 6' (1.829 m)  Wt 74.844 kg (165 lb)  BMI 22.37 kg/m2  SpO2 100% Gen: NAD, alert and oriented x 4 HEENT: PEERLA, EOMI, Neck: supple, no JVD or thyromegaly Chest: CTA bilaterally, no wheezes, crackles, or other adventitious sounds CV: RRR, no m/g/c/r Abd: soft, epigastric>RUQ abdominal tenderness, no gurading, rigidity, or rebound tenderness, active BS. Ext: no edema, well perfused with 2+ pulses, Skin: no rash or lesions noted Lymph: no  LAD  Data: Lab Results  Component Value Date   WBC 19.9* 11/01/2014   HGB 11.8* 11/01/2014   HCT 36.7* 11/01/2014   MCV 85.9 11/01/2014   PLT 172 11/01/2014    Recent Labs Lab 10/31/14 1219 10/31/14 2124 11/01/14 0439  HGB 11.2* 12.7* 11.8*   Lab Results  Component Value Date   NA 137 11/02/2014   K 3.5 11/02/2014   CL 111 11/02/2014   CO2 21* 11/02/2014   BUN <5* 11/02/2014   CREATININE 0.87 11/02/2014   Lab Results  Component Value Date   ALT 31 10/31/2014   AST 31 10/31/2014   ALKPHOS 157* 10/31/2014   BILITOT 1.0 10/31/2014   No results for input(s): APTT, INR, PTT in the last 168 hours. Assessment/Plan: [name] is a 34 y.o. male with abdominal pain with N/V. Pt with DKA often has N/V but usually not abdominal pain. Esophagitis can cause N/V but usualy does not cause significant pain. Hemangiomas do not cause abdominal pain.  Recommendations: In case pt has GB disease, start Abx coverage. MRCP ordered to evaluate liver,GB, and CBD. If CBD dilated with stone/sludge, then ERCP will be scheduled. If CBD normal, then EGD to evaluate distal esophagus will be scheduled tomorrow. MRCP will confirm whether pt has hemangiomas or not. Will follow. Thanks. Please call with questions or concerns.  Elias Bordner, Lupita Dawn, MD

## 2014-11-02 NOTE — Transfer of Care (Signed)
Immediate Anesthesia Transfer of Care Note  Patient: Richard Bray  Procedure(s) Performed: Procedure(s): ESOPHAGOGASTRODUODENOSCOPY (EGD) (Left)  Patient Location: Endoscopy Unit  Anesthesia Type:General  Level of Consciousness: awake, alert , oriented and patient cooperative  Airway & Oxygen Therapy: Patient Spontanous Breathing and Patient connected to nasal cannula oxygen  Post-op Assessment: Report given to RN, Post -op Vital signs reviewed and stable and Patient moving all extremities X 4  Post vital signs: Reviewed and stable  Last Vitals:  Filed Vitals:   11/02/14 1205  BP: 151/96  Pulse: 86  Temp: 35.9 C  Resp: 18    Complications: No apparent anesthesia complications

## 2014-11-02 NOTE — Op Note (Signed)
EGD only showed mild gastritis involving the fundus. Esophagus was completely normal. Abdominal pain with nausea/vomiting coming from gallbladder that is full of stones. Clear liquid diet ordered. Consult general surgery to remove gallbladder. thanks

## 2014-11-02 NOTE — Progress Notes (Addendum)
Inpatient Diabetes Program Recommendations  AACE/ADA: New Consensus Statement on Inpatient Glycemic Control (2013)  Target Ranges:  Prepandial:   less than 140 mg/dL      Peak postprandial:   less than 180 mg/dL (1-2 hours)      Critically ill patients:  140 - 180 mg/dL   Reason for Visit: Type 1 diabetes- Levemir held this am  Outpatient medication:Typical daily dose of insulin is Levemir 24 units qhs and Humalog, based on insulin to carb ratio of 1:13 and correction for high sugars of 1 unit per 28 mg/dl over a target of 130.  Inpatient diabetes medication orders: Levemir 20 units q day, Novolog 0-9 units tid, Novolog 3 units tid with meals  Patient is going down for tests this morning and is therefore NPO.  Levemir was held but I have asked for the basal insulin to be given as ordered.   MD- please order Glycemic control order set; Novolog correction hs scale 0-5 units.   I spoke to the patient and his wife- they are concerned regarding Levemir timing at home now that he is working nights and not working two jobs during the day.  They agreed that 12 noon is a consistent time he is awake when he works nightshift and on his days off. He has committed to taking Levemir daily (when he is discharged) at noon vs.hs.so that he is consistent with giving it.   Gentry Fitz, RN, BA, MHA, CDE Diabetes Coordinator Inpatient Diabetes Program  [phone] (Team Pager) [phone] ([place]) 11/02/2014 10:54 AM

## 2014-11-02 NOTE — Plan of Care (Signed)
Problem: Discharge Progression Outcomes Goal: Discharge plan in place and appropriate Outcome: Progressing Patient is alert and oriented. Complained of abdominal pain, morphine given with improvement. NPO after midnight for MRCP.

## 2014-11-02 NOTE — Anesthesia Preprocedure Evaluation (Addendum)
Anesthesia Evaluation  Patient identified by MRN, date of birth, ID band Patient awake    Reviewed: Allergy & Precautions, NPO status , Patient's Chart, lab work & pertinent test results  Airway Mallampati: II  TM Distance: >3 FB     Dental   Pulmonary          Cardiovascular     Neuro/Psych    GI/Hepatic   Endo/Other  diabetes, Poorly Controlled, Type 1  Renal/GU      Musculoskeletal   Abdominal   Peds  Hematology   Anesthesia Other Findings   Reproductive/Obstetrics                           Anesthesia Physical Anesthesia Plan  ASA: III  Anesthesia Plan: General   Post-op Pain Management:    Induction: Intravenous  Airway Management Planned: Nasal Cannula  Additional Equipment:   Intra-op Plan:   Post-operative Plan:   Informed Consent: I have reviewed the patients History and Physical, chart, labs and discussed the procedure including the risks, benefits and alternatives for the proposed anesthesia with the patient or authorized representative who has indicated his/her understanding and acceptance.     Plan Discussed with: CRNA  Anesthesia Plan Comments:         Anesthesia Quick Evaluation

## 2014-11-02 NOTE — Progress Notes (Signed)
Blood sugar 57 when checked at bedtime. Juice and carbs given.  Will re-check and take action accordingly. Pt to be NPO at midnight for surgery tomorrow.

## 2014-11-02 NOTE — Progress Notes (Signed)
[facility] [place] NAME: Richard Bray    MR#:  [id number]  DATE OF BIRTH:  24-Dec-1980  SUBJECTIVE:  CHIEF COMPLAINT:   Chief Complaint  Patient presents with  . Abdominal Pain     nausea  REVIEW OF SYSTEMS:  CONSTITUTIONAL: No fever, fatigue or weakness.  EYES: No blurred or double vision.  EARS, NOSE, AND THROAT: No tinnitus or ear pain.  RESPIRATORY: No cough, shortness of breath, wheezing or hemoptysis.  CARDIOVASCULAR: No chest pain, orthopnea, edema.  GASTROINTESTINAL: positive nausea, vomiting, no diarrhea, positive abdominal pain.  GENITOURINARY: No dysuria, hematuria.  ENDOCRINE: No polyuria, nocturia,  HEMATOLOGY: No anemia, easy bruising or bleeding SKIN: No rash or lesion. MUSCULOSKELETAL: No joint pain or arthritis.   NEUROLOGIC: No tingling, numbness, weakness.  PSYCHIATRY: No anxiety or depression.   ROS  DRUG ALLERGIES:   Allergies  Allergen Reactions  . Metformin Other (See Comments)    GI Upset    VITALS:  Blood pressure 172/102, pulse 73, temperature 96.6 F (35.9 C), temperature source Tympanic, resp. rate 17, height 6' (1.829 m), weight 74.844 kg (165 lb), SpO2 100 %.  PHYSICAL EXAMINATION:  GENERAL:  34 y.o.-year-old patient lying in the bed with no acute distress.  EYES: Pupils equal, round, reactive to light and accommodation. No scleral icterus. Extraocular muscles intact.  HEENT: Head atraumatic, normocephalic. Oropharynx and nasopharynx clear.  NECK:  Supple, no jugular venous distention. No thyroid enlargement, no tenderness.  LUNGS: Normal breath sounds bilaterally, no wheezing, rales,rhonchi or crepitation. No use of accessory muscles of respiration.  CARDIOVASCULAR: S1, S2 normal. No murmurs, rubs, or gallops.  ABDOMEN: Soft, tender on right upper and lower quadrant and guarding present , nondistended. Bowel sounds present. No organomegaly or mass.  EXTREMITIES: No pedal edema,  cyanosis, or clubbing.  NEUROLOGIC: Cranial nerves II through XII are intact. Muscle strength 5/5 in all extremities. Sensation intact. Gait not checked.  PSYCHIATRIC: The patient is alert and oriented x 3.  SKIN: No obvious rash, lesion, or ulcer.   Physical Exam LABORATORY PANEL:   CBC  Recent Labs Lab 11/01/14 0439  WBC 19.9*  HGB 11.8*  HCT 36.7*  PLT 172   ------------------------------------------------------------------------------------------------------------------  Chemistries   Recent Labs Lab 10/31/14 2124  11/01/14 0439  11/02/14 0544  NA 138  < >  --   < > 137  K 3.7  < >  --   < > 3.5  CL 104  < >  --   < > 111  CO2 15*  < >  --   < > 21*  GLUCOSE 217*  < >  --   < > 101*  BUN 10  < >  --   < > <5*  CREATININE 1.19  < >  --   < > 0.87  CALCIUM 8.9  < >  --   < > 8.0*  MG  --   --  1.7  --   --   AST 31  --   --   --   --   ALT 31  --   --   --   --   ALKPHOS 157*  --   --   --   --   BILITOT 1.0  --   --   --   --   < > = values in this interval not displayed. ------------------------------------------------------------------------------------------------------------------  Cardiac Enzymes No results for input(s): TROPONINI in the last  168 hours. ------------------------------------------------------------------------------------------------------------------  RADIOLOGY:  Dg Chest 1 View  10/31/2014   CLINICAL DATA:  Acute onset of generalized abdominal pain, nausea and vomiting. Initial encounter.  EXAM: CHEST  1 VIEW  COMPARISON:  Chest radiograph performed 05/27/2011  FINDINGS: The lungs are well-aerated and clear. There is no evidence of focal opacification, pleural effusion or pneumothorax.  The cardiomediastinal silhouette is within normal limits. No acute osseous abnormalities are seen.  IMPRESSION: No acute cardiopulmonary process seen.   Electronically Signed   By: Garald Balding M.D.   On: 10/31/2014 21:45   Ct Abdomen Pelvis W  Contrast  11/01/2014   CLINICAL DATA:  Abdominal pain  EXAM: CT ABDOMEN AND PELVIS WITH CONTRAST  TECHNIQUE: Multidetector CT imaging of the abdomen and pelvis was performed using the standard protocol following bolus administration of intravenous contrast.  CONTRAST:  155mL OMNIPAQUE IOHEXOL 300 MG/ML  SOLN  COMPARISON:  None.  FINDINGS: BODY WALL: No contributory findings.  LOWER CHEST: Mild fat haziness around the lower esophagus which does not appear thickened. No lower new pneumomediastinum.  ABDOMEN/PELVIS:  Liver: There are 3 liver lesions, the largest in segment 7 measuring 17 mm. The nodule is mainly hypo enhancing, but does have a peripheral enhancing nodule. The other 2 lesions are seen in the right liver on image 11 and in segment 3 on image 20 both measuring approximately 1 cm and homogeneously hypervascular. No cirrhotic changes in the liver or chart indication of malignancy history.  Biliary: Unexpected density in the gallbladder fundus. No wall thickening or other inflammatory change.  Pancreas: Marked atrophy of the pancreas for age. No duct enlargement or inflammatory change.  Spleen: Unremarkable.  Adrenals: Unremarkable.  Kidneys and ureters: No hydronephrosis or stone.  Bladder: Unremarkable.  Reproductive: No pathologic findings.  Bowel: No obstruction. No appendicitis.  Retroperitoneum: No mass or adenopathy.  Peritoneum: Small non loculated pelvic fluid, unexpected for gender.  Vascular: No acute abnormality.  OSSEOUS: No acute abnormalities.  IMPRESSION: 1. Mild edema around the lower esophagus. Are there symptoms of esophagitis? 2. Pelvic fluid raises the possibility of intra-abdominal inflammation, although no acute intra-abdominal finding seen. 3. Cholelithiasis versus gallbladder sludge. 4. Three liver lesions, up to 17 mm in the upper right lobe. These likely reflect hemangiomas; liver protocol MRI could further characterize. 5. Atrophic pancreas.   Electronically Signed   By: Monte Fantasia M.D.   On: 11/01/2014 13:35   Mr Lambert Mody Cm/mrcp  11/02/2014   CLINICAL DATA:  Abdominal pain. Nausea and vomiting. Symptoms started on 10/30/2014  EXAM: MRI ABDOMEN WITHOUT AND WITH CONTRAST (INCLUDING MRCP)  TECHNIQUE: Multiplanar multisequence MR imaging of the abdomen was performed both before and after the administration of intravenous contrast. Heavily T2-weighted images of the biliary and pancreatic ducts were obtained, and three-dimensional MRCP images were rendered by post processing.  CONTRAST:  57mL MULTIHANCE GADOBENATE DIMEGLUMINE 529 MG/ML IV SOLN  COMPARISON:  11/01/2014  FINDINGS: Despite efforts by the technologist and patient, motion artifact is present on today's exam and could not be eliminated. This reduces exam sensitivity and specificity.  Lower chest:  Unremarkable  Hepatobiliary: Numerous gallstones fill the gallbladder. One of these posteriorly is larger, measuring 2.5 cm in long axis, or is most of the other gallstones are small.  No biliary dilatation or compelling findings of choledocholithiasis.  Unfortunately the top of the liver was excluded on all axial images.  Multiple hepatic hemangiomas are present. These include a 1.8 cm lesion posteriorly in segment 7  of the liver (image 40, series 14) and a 1.1 cm hemangioma in the left hepatic lobe on image 20 series 14. These lesions demonstrate delayed enhancement and high precontrast T2 signal intensity.  Pancreas: Unremarkable  Spleen: Unremarkable  Adrenals/Urinary Tract: Unremarkable  Stomach/Bowel: Unremarkable  Vascular/Lymphatic: Unremarkable  Other: Trace free fluid along the inferior margin of the right hepatic lobe.  Musculoskeletal: Unremarkable  IMPRESSION: 1. Numerous gallstones fill in the gallbladder. Most of these are small but there is a larger gallstone measuring 2.5 cm in long axis diameter. 2. Multiple hepatic hemangiomas. 3. Incidental trace free fluid along the inferior margin of the right hepatic lobe.    Electronically Signed   By: Van Clines M.D.   On: 11/02/2014 10:00     ASSESSMENT AND PLAN:   * DKA   Resolved now with Insulin drip,     transit to basal and bolus insulin.    Appreciated Consult Dr. Gabriel Carina as he have fluctuating schedule including night shift, to manage his timing of     Insulin- she is already working to get insulin pump for him.  * Abdominal pain- gall stone.   And nausea    Persist tenderness and guarding    Reviewed CT abdomen- called GI- he suggested MRCP and did endoscopy.    Endoscopy is negative, pt have multiple gall bladder stones- and he will need surgery for that.    Calleld consult.   * leukocytosis   UA negative, will get CT abdomen.   No fever, no need for Abx for now- it may also be due to DKA.  All the records are reviewed and case discussed with Care Management/Social Workerr. Management plans discussed with the patient, family and they are in agreement.  CODE STATUS: full  TOTAL TIME TAKING CARE OF THIS PATIENT: 35 minutes.   POSSIBLE D/C IN 1-2 DAYS, DEPENDING ON CLINICAL CONDITION.   Vaughan Basta M.D on 11/02/2014   Between 7am to 6pm - Pager - [phone]  After 6pm go to [email or web address] - password [email or web address] [facility]  Office  [phone]  CC: Primary care physician; Lucilla Lame, MD

## 2014-11-02 NOTE — Op Note (Signed)
[facility] Gastroenterology Patient Name: Richard Bray Procedure Date: 11/02/2014 10:40 AM MRN: [id number] Account #: [id number] Date of Birth: 1980/06/30 Admit Type: Inpatient Age: 34 Room: [facility] ENDO ROOM 4 Gender: Male Note Status: Finalized Procedure:         Upper GI endoscopy Indications:       Epigastric abdominal pain, Abdominal pain in the right                     upper quadrant, Abnormal CT of the GI tract, Nausea with                     vomiting Providers:         Lupita Dawn. Candace Cruise, MD Referring MD:      Betsey Holiday. Solum, MD (Referring MD) Medicines:         Monitored Anesthesia Care Complications:     No immediate complications. Procedure:         Pre-Anesthesia Assessment:                    - Prior to the procedure, a History and Physical was                     performed, and patient medications, allergies and                     sensitivities were reviewed. The patient's tolerance of                     previous anesthesia was reviewed.                    - The risks and benefits of the procedure and the sedation                     options and risks were discussed with the patient. All                     questions were answered and informed consent was obtained.                    - After reviewing the risks and benefits, the patient was                     deemed in satisfactory condition to undergo the procedure.                    After obtaining informed consent, the endoscope was passed                     under direct vision. Throughout the procedure, the                     patient's blood pressure, pulse, and oxygen saturations                     were monitored continuously. The Olympus GIF-160 endoscope                     (S#. [id number]) was introduced through the mouth, and                     advanced to the second part of duodenum. The upper GI  endoscopy was accomplished without difficulty. The patient    tolerated the procedure well. Findings:      The examined esophagus was normal.      Localized mildly erythematous mucosa was found in the gastric fundus.      The exam was otherwise without abnormality.      The examined duodenum was normal. Impression:        - Normal esophagus.                    - Erythematous mucosa in the gastric fundus.                    - The examination was otherwise normal.                    - Normal examined duodenum.                    - No specimens collected.                    - Pain from GB and not from esophagus or stomach. Recommendation:    - Continue present medications.                    - The findings and recommendations were discussed with the                     patient's family.                    - Needs GB surgery. Procedure Code(s): --- Professional ---                    [phone], Esophagogastroduodenoscopy, flexible, transoral;                     diagnostic, including collection of specimen(s) by                     brushing or washing, when performed (separate procedure) Diagnosis Code(s): --- Professional ---                    K31.9, Disease of stomach and duodenum, unspecified                    R10.13, Epigastric pain                    R10.11, Right upper quadrant pain                    R11.2, Nausea with vomiting, unspecified                    R93.3, Abnormal findings on diagnostic imaging of other                     parts of digestive tract CPT copyright 2014 [employer]. All rights reserved. The codes documented in this report are preliminary and upon coder review may  be revised to meet current compliance requirements. Hulen Luster, MD 11/02/2014 11:45:14 AM This report has been signed electronically. Number of Addenda: 0 Note Initiated On: 11/02/2014 10:40 AM      [facility]

## 2014-11-03 ENCOUNTER — Inpatient Hospital Stay: Payer: No Typology Code available for payment source | Admitting: Anesthesiology

## 2014-11-03 ENCOUNTER — Encounter
Admission: EM | Disposition: A | Discharge status: Home / Self Care | Payer: Self-pay | Source: Home / Self Care | Attending: Surgery

## 2014-11-03 ENCOUNTER — Encounter: Payer: Self-pay | Admitting: Surgery

## 2014-11-03 ENCOUNTER — Inpatient Hospital Stay: Payer: No Typology Code available for payment source

## 2014-11-03 DIAGNOSIS — R1011 Right upper quadrant pain: Secondary | ICD-10-CM | POA: Insufficient documentation

## 2014-11-03 DIAGNOSIS — K819 Cholecystitis, unspecified: Secondary | ICD-10-CM | POA: Insufficient documentation

## 2014-11-03 HISTORY — PX: CHOLECYSTECTOMY: SHX55

## 2014-11-03 LAB — CBC WITH DIFFERENTIAL/PLATELET
BASOS ABS: 0 10*3/uL (ref 0–0.1)
BASOS PCT: 1 %
Eosinophils Absolute: 0 10*3/uL (ref 0–0.7)
Eosinophils Relative: 1 %
HEMATOCRIT: 33.1 % — AB (ref 40.0–52.0)
Hemoglobin: 11 g/dL — ABNORMAL LOW (ref 13.0–18.0)
Lymphocytes Relative: 17 %
Lymphs Abs: 1.6 10*3/uL (ref 1.0–3.6)
MCH: 27.8 pg (ref 26.0–34.0)
MCHC: 33.3 g/dL (ref 32.0–36.0)
MCV: 83.4 fL (ref 80.0–100.0)
MONOS PCT: 6 %
Monocytes Absolute: 0.5 10*3/uL (ref 0.2–1.0)
NEUTROS ABS: 7 10*3/uL — AB (ref 1.4–6.5)
NEUTROS PCT: 75 %
Platelets: 155 10*3/uL (ref 150–440)
RBC: 3.97 MIL/uL — ABNORMAL LOW (ref 4.40–5.90)
RDW: 12.7 % (ref 11.5–14.5)
WBC: 9.2 10*3/uL (ref 3.8–10.6)

## 2014-11-03 LAB — GLUCOSE, CAPILLARY
Glucose-Capillary: 86 mg/dL (ref 65–99)
Glucose-Capillary: 133 mg/dL — ABNORMAL HIGH (ref 65–99)
Glucose-Capillary: 237 mg/dL — ABNORMAL HIGH (ref 65–99)
Glucose-Capillary: 205 mg/dL — ABNORMAL HIGH (ref 65–99)
Glucose-Capillary: 193 mg/dL — ABNORMAL HIGH (ref 65–99)

## 2014-11-03 LAB — URINE CULTURE: CULTURE: NO GROWTH

## 2014-11-03 LAB — COMPREHENSIVE METABOLIC PANEL
ALK PHOS: 109 U/L (ref 38–126)
ALT: 20 U/L (ref 17–63)
ANION GAP: 9 (ref 5–15)
AST: 18 U/L (ref 15–41)
Albumin: 3.4 g/dL — ABNORMAL LOW (ref 3.5–5.0)
BILIRUBIN TOTAL: 0.9 mg/dL (ref 0.3–1.2)
BUN: 7 mg/dL (ref 6–20)
CHLORIDE: 107 mmol/L (ref 101–111)
CO2: 24 mmol/L (ref 22–32)
Calcium: 8.4 mg/dL — ABNORMAL LOW (ref 8.9–10.3)
Creatinine, Ser: 0.8 mg/dL (ref 0.61–1.24)
GFR calc Af Amer: >60 mL/min (ref >60)
GLUCOSE: 106 mg/dL — AB (ref 65–99)
Potassium: 3 mmol/L — ABNORMAL LOW (ref 3.5–5.1)
SODIUM: 140 mmol/L (ref 135–145)
TOTAL PROTEIN: 5.4 g/dL — AB (ref 6.5–8.1)

## 2014-11-03 LAB — POTASSIUM: Potassium: 3.1 mmol/L — ABNORMAL LOW (ref 3.5–5.1)

## 2014-11-03 LAB — MAGNESIUM: MAGNESIUM: 1.7 mg/dL (ref 1.7–2.4)

## 2014-11-03 SURGERY — LAPAROSCOPIC CHOLECYSTECTOMY WITH INTRAOPERATIVE CHOLANGIOGRAM
Anesthesia: General | Wound class: Contaminated

## 2014-11-03 MED ORDER — SUGAMMADEX SODIUM 200 MG/2ML IV SOLN
INTRAVENOUS | Status: DC | PRN
  Administered 2014-11-03: 150 mg via INTRAVENOUS

## 2014-11-03 MED ORDER — HYDROCODONE-ACETAMINOPHEN 7.5-325 MG PO TABS: 1.0000 | ORAL_TABLET | Freq: Once | ORAL | Status: DC | PRN

## 2014-11-03 MED ORDER — PROMETHAZINE HCL 25 MG/ML IJ SOLN: 6.2500 mg | Freq: Once | INTRAMUSCULAR | Status: DC

## 2014-11-03 MED ORDER — PROPOFOL 10 MG/ML IV BOLUS
INTRAVENOUS | Status: DC | PRN
  Administered 2014-11-03: 150 mg via INTRAVENOUS
  Administered 2014-11-03: 50 mg via INTRAVENOUS

## 2014-11-03 MED ORDER — SODIUM CHLORIDE 0.9 % IR SOLN
Status: DC | PRN
  Administered 2014-11-03: 800 mL

## 2014-11-03 MED ORDER — DEXAMETHASONE SODIUM PHOSPHATE 4 MG/ML IJ SOLN
INTRAMUSCULAR | Status: DC | PRN
  Administered 2014-11-03: 10 mg via INTRAVENOUS

## 2014-11-03 MED ORDER — POTASSIUM CHLORIDE IN NACL 40-0.9 MEQ/L-% IV SOLN
INTRAVENOUS | Status: DC
Start: 2014-11-03 — End: 2014-11-04
  Administered 2014-11-03 (×2): 100 mL/h via INTRAVENOUS
  Filled 2014-11-03 (×6): qty 1000

## 2014-11-03 MED ORDER — LABETALOL HCL 5 MG/ML IV SOLN
INTRAVENOUS | Status: DC | PRN
  Administered 2014-11-03: 10 mg via INTRAVENOUS

## 2014-11-03 MED ORDER — CEFAZOLIN SODIUM 1-5 GM-% IV SOLN
INTRAVENOUS | Status: DC | PRN
  Administered 2014-11-03: 1 g via INTRAVENOUS

## 2014-11-03 MED ORDER — DEXMEDETOMIDINE HCL 200 MCG/2ML IV SOLN
INTRAVENOUS | Status: DC | PRN
  Administered 2014-11-03: 8 ug via INTRAVENOUS
  Administered 2014-11-03: 12 ug via INTRAVENOUS

## 2014-11-03 MED ORDER — LACTATED RINGERS IV SOLN
INTRAVENOUS | Status: DC | PRN
  Administered 2014-11-03 (×2): via INTRAVENOUS

## 2014-11-03 MED ORDER — BUPIVACAINE-EPINEPHRINE (PF) 0.25% -1:200000 IJ SOLN
INTRAMUSCULAR | Status: DC | PRN
  Administered 2014-11-03: 30 mL

## 2014-11-03 MED ORDER — ONDANSETRON HCL 4 MG/2ML IJ SOLN: 4.0000 mg | Freq: Once | INTRAMUSCULAR | Status: DC | PRN

## 2014-11-03 MED ORDER — BUPIVACAINE-EPINEPHRINE (PF) 0.25% -1:200000 IJ SOLN
INTRAMUSCULAR | Status: AC
  Filled 2014-11-03: qty 30

## 2014-11-03 MED ORDER — LIDOCAINE HCL (CARDIAC) 20 MG/ML IV SOLN
INTRAVENOUS | Status: DC | PRN
  Administered 2014-11-03: 100 mg via INTRAVENOUS

## 2014-11-03 MED ORDER — SUCCINYLCHOLINE CHLORIDE 20 MG/ML IJ SOLN
INTRAMUSCULAR | Status: DC | PRN
  Administered 2014-11-03: 100 mg via INTRAVENOUS

## 2014-11-03 MED ORDER — FENTANYL CITRATE (PF) 100 MCG/2ML IJ SOLN
INTRAMUSCULAR | Status: DC | PRN
  Administered 2014-11-03: 50 ug via INTRAVENOUS
  Administered 2014-11-03: 150 ug via INTRAVENOUS
  Administered 2014-11-03: 100 ug via INTRAVENOUS

## 2014-11-03 MED ORDER — HYDROCODONE-ACETAMINOPHEN 5-325 MG PO TABS
1.0000 | ORAL_TABLET | ORAL | Status: DC | PRN
  Administered 2014-11-03 – 2014-11-04 (×4): 1 via ORAL
  Filled 2014-11-03 (×4): qty 1

## 2014-11-03 MED ORDER — PROMETHAZINE HCL 25 MG/ML IJ SOLN: 6.2500 mg | Freq: Four times a day (QID) | INTRAMUSCULAR | Status: DC | PRN

## 2014-11-03 MED ORDER — ROCURONIUM BROMIDE 100 MG/10ML IV SOLN
INTRAVENOUS | Status: DC | PRN
  Administered 2014-11-03: 20 mg via INTRAVENOUS
  Administered 2014-11-03: 10 mg via INTRAVENOUS

## 2014-11-03 SURGICAL SUPPLY — 45 items
ADHESIVE MASTISOL STRL (MISCELLANEOUS) ×3 IMPLANT
APPLIER CLIP ROT 10 11.4 M/L (STAPLE) ×3
BLADE SURG SZ11 CARB STEEL (BLADE) ×3 IMPLANT
CANISTER SUCT 1200ML W/VALVE (MISCELLANEOUS) ×3 IMPLANT
CATH CHOLANGI 4FR 420404F (CATHETERS) ×3 IMPLANT
CHLORAPREP W/TINT 26ML (MISCELLANEOUS) ×3 IMPLANT
CLIP APPLIE ROT 10 11.4 M/L (STAPLE) ×1 IMPLANT
CLOSURE WOUND 1/2 X4 (GAUZE/BANDAGES/DRESSINGS) ×1
CONRAY 60ML FOR OR (MISCELLANEOUS) ×3 IMPLANT
DRAPE C-ARM XRAY 36X54 (DRAPES) ×3 IMPLANT
ENDOPOUCH RETRIEVER 10 (MISCELLANEOUS) IMPLANT
GAUZE SPONGE NON-WVN 2X2 STRL (MISCELLANEOUS) ×4 IMPLANT
GLOVE BIO SURGEON STRL SZ8 (GLOVE) ×21 IMPLANT
GOWN STRL REUS W/ TWL LRG LVL3 (GOWN DISPOSABLE) ×4 IMPLANT
GOWN STRL REUS W/TWL LRG LVL3 (GOWN DISPOSABLE) ×8
IRRIGATION STRYKERFLOW (MISCELLANEOUS) ×1 IMPLANT
IRRIGATOR STRYKERFLOW (MISCELLANEOUS) ×3
IV CATH ANGIO 12GX3 LT BLUE (NEEDLE) ×3 IMPLANT
IV NS 1000ML (IV SOLUTION) ×2
IV NS 1000ML BAXH (IV SOLUTION) ×1 IMPLANT
JACKSON PRATT 10 (INSTRUMENTS) IMPLANT
KIT RM TURNOVER STRD PROC AR (KITS) ×3 IMPLANT
LABEL OR SOLS (LABEL) ×3 IMPLANT
NDL SAFETY 22GX1.5 (NEEDLE) ×3 IMPLANT
NEEDLE VERESS 14GA 120MM (NEEDLE) ×3 IMPLANT
NS IRRIG 500ML POUR BTL (IV SOLUTION) ×3 IMPLANT
PACK LAP CHOLECYSTECTOMY (MISCELLANEOUS) ×3 IMPLANT
PAD ABD DERMACEA PRESS 5X9 (GAUZE/BANDAGES/DRESSINGS) ×6 IMPLANT
PAD GROUND ADULT SPLIT (MISCELLANEOUS) ×3 IMPLANT
SCISSORS METZENBAUM CVD 33 (INSTRUMENTS) ×3 IMPLANT
SLEEVE ENDOPATH XCEL 5M (ENDOMECHANICALS) ×3 IMPLANT
SPONGE EXCIL AMD DRAIN 4X4 6P (MISCELLANEOUS) IMPLANT
SPONGE LAP 18X18 5 PK (GAUZE/BANDAGES/DRESSINGS) ×3 IMPLANT
SPONGE VERSALON 2X2 STRL (MISCELLANEOUS) ×8
STRIP CLOSURE SKIN 1/2X4 (GAUZE/BANDAGES/DRESSINGS) ×2 IMPLANT
SUT ETHILON 3-0 FS-10 30 BLK (SUTURE) ×3
SUT MNCRL 4-0 (SUTURE) ×4
SUT MNCRL 4-0 27XMFL (SUTURE) ×2
SUT VICRYL 0 AB UR-6 (SUTURE) ×3 IMPLANT
SUTURE EHLN 3-0 FS-10 30 BLK (SUTURE) ×1 IMPLANT
SUTURE MNCRL 4-0 27XMF (SUTURE) ×2 IMPLANT
SYR 20CC LL (SYRINGE) ×3 IMPLANT
TROCAR XCEL NON-BLD 11X100MML (ENDOMECHANICALS) ×3 IMPLANT
TROCAR XCEL NON-BLD 5MMX100MML (ENDOMECHANICALS) ×6 IMPLANT
TUBING INSUFFLATOR HI FLOW (MISCELLANEOUS) ×3 IMPLANT

## 2014-11-03 NOTE — Plan of Care (Signed)
Problem: Discharge Progression Outcomes Goal: Other Discharge Outcomes/Goals Plan of care progress to goals: 1. Intermittent abdominal pain and nausea relieved by Morphine given x2, Zofran x1, & Phenergan x1. 2. Hemodynamically: VSS, afebrile, IVF infusing  3. Blood sugar 57 at bedtime, juice and crackers given, improved to 80. Recheck at 0145 was 67. 4. NPO for scheduled lap chole today 5. Independent with wife at bedside active in care. Will continue to assess.

## 2014-11-03 NOTE — Progress Notes (Signed)
Dr Burt Knack, patient is NPO-hold determir insulin, if blood sugar over 170 give scheduled 3 units

## 2014-11-03 NOTE — Plan of Care (Addendum)
Problem: Discharge Progression Outcomes Goal: Other Discharge Outcomes/Goals Outcome: Progressing Plan of Care Progressing to Goal: Pain medications given with noted relief. VSS. Nausea meds given with relief. Lap Chole performed without complications, dressing to abdomen dry and in tact. Wife at bedside.

## 2014-11-03 NOTE — Plan of Care (Addendum)
Problem: Discharge Progression Outcomes Goal: CBGs controlled on DM discharge meds I

## 2014-11-03 NOTE — Anesthesia Preprocedure Evaluation (Signed)
Anesthesia Evaluation  Patient identified by MRN, date of birth, ID band Patient awake    Reviewed: Allergy & Precautions, NPO status , Patient's Chart, lab work & pertinent test results  Airway Mallampati: I  TM Distance: >3 FB Neck ROM: Full    Dental  (+) Teeth Intact   Pulmonary  breath sounds clear to auscultation  Pulmonary exam normal       Cardiovascular Exercise Tolerance: Good Rhythm:Regular Rate:Normal     Neuro/Psych    GI/Hepatic   Endo/Other  diabetes, Poorly Controlled, Type 1, Insulin DependentHb A1c 8.8. This am BG 106.  Renal/GU      Musculoskeletal   Abdominal Some nausea this morning, no vx, NPO since last eve, emesis last eve.  Peds  Hematology   Anesthesia Other Findings   Reproductive/Obstetrics                             Anesthesia Physical Anesthesia Plan  ASA: III and emergent  Anesthesia Plan: General   Post-op Pain Management:    Induction: Intravenous and Rapid sequence  Airway Management Planned: Oral ETT  Additional Equipment:   Intra-op Plan:   Post-operative Plan: Extubation in OR  Informed Consent: I have reviewed the patients History and Physical, chart, labs and discussed the procedure including the risks, benefits and alternatives for the proposed anesthesia with the patient or authorized representative who has indicated his/her understanding and acceptance.     Plan Discussed with: CRNA  Anesthesia Plan Comments:         Anesthesia Quick Evaluation

## 2014-11-03 NOTE — Progress Notes (Signed)
[facility] NAME: Uriah Philipson    MR#:  [id number]  DATE OF BIRTH:  1980-12-05  SUBJECTIVE:  CHIEF COMPLAINT:   Chief Complaint  Patient presents with  . Abdominal Pain      S/p laproscopic cholecystectomy 11/03/14- tolerated well.  REVIEW OF SYSTEMS:  CONSTITUTIONAL: No fever, fatigue or weakness.  EYES: No blurred or double vision.  EARS, NOSE, AND THROAT: No tinnitus or ear pain.  RESPIRATORY: No cough, shortness of breath, wheezing or hemoptysis.  CARDIOVASCULAR: No chest pain, orthopnea, edema.  GASTROINTESTINAL: positive nausea, vomiting, no diarrhea, positive abdominal pain.  GENITOURINARY: No dysuria, hematuria.  ENDOCRINE: No polyuria, nocturia,  HEMATOLOGY: No anemia, easy bruising or bleeding SKIN: No rash or lesion. MUSCULOSKELETAL: No joint pain or arthritis.   NEUROLOGIC: No tingling, numbness, weakness.  PSYCHIATRY: No anxiety or depression.   ROS  DRUG ALLERGIES:   Allergies  Allergen Reactions  . Metformin Other (See Comments)    GI Upset    VITALS:  Blood pressure 132/80, pulse 78, temperature 98.3 F (36.8 C), temperature source Oral, resp. rate 14, height 6' (1.829 m), weight 74.844 kg (165 lb), SpO2 100 %.  PHYSICAL EXAMINATION:  GENERAL:  34 y.o.-year-old patient lying in the bed with no acute distress.  EYES: Pupils equal, round, reactive to light and accommodation. No scleral icterus. Extraocular muscles intact.  HEENT: Head atraumatic, normocephalic. Oropharynx and nasopharynx clear.  NECK:  Supple, no jugular venous distention. No thyroid enlargement, no tenderness.  LUNGS: Normal breath sounds bilaterally, no wheezing, rales,rhonchi or crepitation. No use of accessory muscles of respiration.  CARDIOVASCULAR: S1, S2 normal. No murmurs, rubs, or gallops.  ABDOMEN: Soft, tender on right upper and lower quadrant and guarding present , nondistended. Bowel sounds present. No organomegaly  or mass. Dressing after sx present. EXTREMITIES: No pedal edema, cyanosis, or clubbing.  NEUROLOGIC: Cranial nerves II through XII are intact. Muscle strength 5/5 in all extremities. Sensation intact. Gait not checked.  PSYCHIATRIC: The patient is alert and oriented x 3.  SKIN: No obvious rash, lesion, or ulcer.   Physical Exam LABORATORY PANEL:   CBC  Recent Labs Lab 11/03/14 0501  WBC 9.2  HGB 11.0*  HCT 33.1*  PLT 155   ------------------------------------------------------------------------------------------------------------------  Chemistries   Recent Labs Lab 11/01/14 0439  11/03/14 0501  NA  --   < > 140  K  --   < > 3.0*  CL  --   < > 107  CO2  --   < > 24  GLUCOSE  --   < > 106*  BUN  --   < > 7  CREATININE  --   < > 0.80  CALCIUM  --   < > 8.4*  MG 1.7  --   --   AST  --   < > 18  ALT  --   < > 20  ALKPHOS  --   < > 109  BILITOT  --   < > 0.9  < > = values in this interval not displayed. ------------------------------------------------------------------------------------------------------------------  Cardiac Enzymes No results for input(s): TROPONINI in the last 168 hours. ------------------------------------------------------------------------------------------------------------------  RADIOLOGY:  Mr Lambert Mody Cm/mrcp  11/02/2014   CLINICAL DATA:  Abdominal pain. Nausea and vomiting. Symptoms started on 10/30/2014  EXAM: MRI ABDOMEN WITHOUT AND WITH CONTRAST (INCLUDING MRCP)  TECHNIQUE: Multiplanar multisequence MR imaging of the abdomen was performed both before and after the administration of intravenous contrast.  Heavily T2-weighted images of the biliary and pancreatic ducts were obtained, and three-dimensional MRCP images were rendered by post processing.  CONTRAST:  74mL MULTIHANCE GADOBENATE DIMEGLUMINE 529 MG/ML IV SOLN  COMPARISON:  11/01/2014  FINDINGS: Despite efforts by the technologist and patient, motion artifact is present on today's exam and  could not be eliminated. This reduces exam sensitivity and specificity.  Lower chest:  Unremarkable  Hepatobiliary: Numerous gallstones fill the gallbladder. One of these posteriorly is larger, measuring 2.5 cm in long axis, or is most of the other gallstones are small.  No biliary dilatation or compelling findings of choledocholithiasis.  Unfortunately the top of the liver was excluded on all axial images.  Multiple hepatic hemangiomas are present. These include a 1.8 cm lesion posteriorly in segment 7 of the liver (image 40, series 14) and a 1.1 cm hemangioma in the left hepatic lobe on image 20 series 14. These lesions demonstrate delayed enhancement and high precontrast T2 signal intensity.  Pancreas: Unremarkable  Spleen: Unremarkable  Adrenals/Urinary Tract: Unremarkable  Stomach/Bowel: Unremarkable  Vascular/Lymphatic: Unremarkable  Other: Trace free fluid along the inferior margin of the right hepatic lobe.  Musculoskeletal: Unremarkable  IMPRESSION: 1. Numerous gallstones fill in the gallbladder. Most of these are small but there is a larger gallstone measuring 2.5 cm in long axis diameter. 2. Multiple hepatic hemangiomas. 3. Incidental trace free fluid along the inferior margin of the right hepatic lobe.   Electronically Signed   By: Van Clines M.D.   On: 11/02/2014 10:00     ASSESSMENT AND PLAN:   * DKA   Resolved now with Insulin drip,     transit to basal and bolus insulin.    Appreciated Consult Dr. Gabriel Carina as he have fluctuating schedule including night shift, to manage his timing of     Insulin- she is already working to get insulin pump for him.   Blood sugar is better controlled now.  * Abdominal pain- gall stone.   Acute cholecystitis ( per surgical note)   And nausea    Persist tenderness and guarding    Reviewed CT abdomen- called GI- he suggested MRCP and did endoscopy.    Endoscopy is negative, pt have multiple gall bladder stones- on 11/03/14- lap cholecystectomy done.    On levaquin.  * leukocytosis   UA negative, reviewed CT abdomen.   No fever, no need for Abx for now- it may also be due to DKA.  All the records are reviewed and case discussed with Care Management/Social Workerr. Management plans discussed with the patient, family and they are in agreement.  CODE STATUS: full  TOTAL TIME TAKING CARE OF THIS PATIENT: 35 minutes.  More than 50% of time spent in co-ordination of care.  POSSIBLE D/C IN 1-2 DAYS, DEPENDING ON CLINICAL CONDITION.   Vaughan Basta M.D on 11/03/2014   Between 7am to 6pm - Pager - [phone]  After 6pm go to [email or web address] - password [email or web address] [facility]  Office  [phone]  CC: Primary care physician; Lucilla Lame, MD

## 2014-11-03 NOTE — Progress Notes (Signed)
Preoperative Review   Patient is met in the preoperative holding area. The history is reviewed in the chart and with the patient. I personally reviewed the options and rationale as well as the risks of this procedure that have been previously discussed with the patient. All questions asked by the patient and/or family were answered to their satisfaction.  Patient agrees to proceed with this procedure at this time.  Richard E Cooper M.D. FACS  

## 2014-11-03 NOTE — Progress Notes (Signed)
Richard Bray is a 34 y.o. male seen in follow up for type 1 diabetes. Today he underwent lap chole due to cholecystitis. Pt seen in room with wife at bedside. Eating liquid supper. Tol liquid lunch. Denies abd pain and N/V. Sugars in the range of 133 - 237 today. Of note, he did receive dexamethasone 10 mg at 10 AM.   Medical history Past Medical History  Diagnosis Date  . Type 1 diabetes mellitus     Diagnosed at 34 yo with DKA / ICU admission  . Diabetes mellitus      Surgical history Past Surgical History  Procedure Laterality Date  . Cholecystectomy N/A 11/03/2014    Procedure: Laparoscopic cholecystectomy ;  Surgeon: Florene Glen, MD;  Location: [facility] ORS;  Service: General;  Laterality: N/A;     Medications . enoxaparin (LOVENOX) injection  40 mg Subcutaneous Q24H  . insulin aspart  0-9 Units Subcutaneous TID WC  . insulin aspart  3 Units Subcutaneous TID WC  . insulin detemir  20 Units Subcutaneous Q24H  . levofloxacin  500 mg Oral Daily  . lisinopril  20 mg Oral Daily  . pantoprazole  40 mg Oral Daily  . potassium chloride  20 mEq Oral BID     Social history History  Substance Use Topics  . Smoking status: Never Smoker   . Smokeless tobacco: Never Used  . Alcohol Use: No     Family history Family History  Problem Relation Age of Onset  . Sickle cell anemia Father   . Aneurysm Mother     Cerebral      Review of systems CV: no chest pain or palpitations PULM: no cough or shortness of breath ABD: no abdominal pain or nausea or vomiting   Physical Exam BP 132/80 mmHg  Pulse 78  Temp(Src) 98.3 F (36.8 C) (Oral)  Resp 14  Ht 6' (1.829 m)  Wt 74.844 kg (165 lb)  BMI 22.37 kg/m2  SpO2 100%  GEN: well developed, well nourished male, in NAD. HEENT: No proptosis, EOMI, lid lag or stare. Oropharynx is clear.  NECK: supple, trachea midline.   RESPIRATORY: clear bilaterally, no wheeze, good inspiratory effort. CV: No carotid bruits, RRR. ABD: soft,  non-distended. EXT: no peripheral edema SKIN: no dermatopathy or rash or acanthosis nigricans.  LYMPH: no submandibular or supraclavicular LAD PSYC: alert and oriented, good insight  Assessment Type 1 diabetes, uncontrolled Status post cholecystectomy  Plan -Sugars higher today after he received steroids.  -Reviewed proper dosing of insulin. -Continue with current insulin orders. -Diet per surgery.  -Anticipate out-pt follow-up next week with office diabetes educator and then several weeks later with me. Patient is aware of these visits.  I am unavailable over the upcoming weekend. Will follow up on Monday 11/06/14 if he remains hospitalized.-

## 2014-11-03 NOTE — Progress Notes (Addendum)
Blood sugar 80. Scheduled to re-check again at 2am

## 2014-11-03 NOTE — Progress Notes (Signed)
Inpatient Diabetes Program Recommendations  AACE/ADA: New Consensus Statement on Inpatient Glycemic Control (2013)  Target Ranges:  Prepandial:   less than 140 mg/dL      Peak postprandial:   less than 180 mg/dL (1-2 hours)      Critically ill patients:  140 - 180 mg/dL   Results for DENI, LEFEVER (MRN [id number]) as of 11/03/2014 08:00  Ref. Range 11/02/2014 13:24 11/02/2014 16:07 11/02/2014 22:05 11/02/2014 22:54 11/03/2014 01:45  Glucose-Capillary Latest Ref Range: 65-99 mg/dL 387 (H) 312 (H) 57 (L) 80 86    Reason for assessment: elevated CBG  Diabetes history: Type 1 Outpatient Diabetes medications:  Outpatient medication:Typical daily dose of insulin is Levemir 24 units qhs and Humalog, based on insulin to carb ratio of 1:13 and correction for high sugars of 1 unit per 28 mg/dl over a target of 130.  Inpatient diabetes medication orders: Levemir 20 units q day, Novolog 0-9 units tid, Novolog 3 units tid with meals   Elevated CBG yesterday because patient did not get correction insulin yesterday am when he was NPO.  This resulted in an elevated CBG in the afternoon.  Then a very large dose of correction Novolog was required, resulting in the hs low.   When I met with the patient and his wife, they were under the impression that they had to wait until blood sugars were 70-120 mg/dl before giving the Levemir- this often resulted in great delays and more than 24 hours between daily Levemir doses.  I have educated them that the Levemir is to be given every 24hrs regardless of what the blood sugar is and the resulting goal is to get his blood sugar to be between 70-185m/dl by using the Levemir and Novolog.  Verbalized great relief in understanding.  JGentry Fitz RN, BA, MHA, CDE Diabetes Coordinator Inpatient Diabetes Program  [phone](Team Pager) [phone]([place] 11/03/2014 8:33 AM

## 2014-11-03 NOTE — Progress Notes (Signed)
Dr Gabriel Carina, patient NPO give determir, hold both novolog orders

## 2014-11-03 NOTE — Transfer of Care (Signed)
Immediate Anesthesia Transfer of Care Note  Patient: Richard Bray  Procedure(s) Performed: Procedure(s): Laparoscopic cholecystectomy  (N/A)  Patient Location: PACU  Anesthesia Type:General  Level of Consciousness: awake and oriented  Airway & Oxygen Therapy: Patient Spontanous Breathing and Patient connected to face mask oxygen  Post-op Assessment: Report given to RN and Post -op Vital signs reviewed and stable  Post vital signs: Reviewed and stable  Last Vitals:  Filed Vitals:   11/03/14 0504  BP: 117/64  Pulse: 69  Temp: 37.4 C  Resp: 16    Complications: No apparent anesthesia complications

## 2014-11-03 NOTE — Op Note (Addendum)
Laparoscopic Cholecystectomy  Pre-operative Diagnosis: Biliary colic  Post-operative Diagnosis: Acute cholecystitis  Surgeon: Jerrol Banana. Burt Knack, MD FACS  Anesthesia: Gen. with endotracheal tube  Assistant: PA student  Procedure Details  The patient was seen again in the Holding Room. The benefits, complications, treatment options, and expected outcomes were discussed with the patient. The risks of bleeding, infection, recurrence of symptoms, failure to resolve symptoms, bile duct damage, bile duct leak, retained common bile duct stone, bowel injury, any of which could require further surgery and/or ERCP, stent, or papillotomy were reviewed with the patient. The likelihood of improving the patient's symptoms with return to their baseline status is good.  The patient and/or family concurred with the proposed plan, giving informed consent.  The patient was taken to Operating Room, identified as Richard Bray and the procedure verified as Laparoscopic Cholecystectomy.  A Time Out was held and the above information confirmed.  Prior to the induction of general anesthesia, antibiotic prophylaxis was administered. VTE prophylaxis was in place. General endotracheal anesthesia was then administered and tolerated well. After the induction, the abdomen was prepped with Chloraprep and draped in the sterile fashion. The patient was positioned in the supine position.  Local anesthetic  was injected into the skin near the umbilicus and an incision made. The Veress needle was placed. Pneumoperitoneum was then created with CO2 and tolerated well without any adverse changes in the patient's vital signs. A 30mm port was placed in the periumbilical position and the abdominal cavity was explored.  Two 5-mm ports were placed in the right upper quadrant and a 12 mm epigastric port was placed all under direct vision. All skin incisions  were infiltrated with a local anesthetic agent before making the incision and placing  the trocars.   The patient was positioned  in reverse Trendelenburg, tilted slightly to the patient's left.  The gallbladder was identified, the fundus grasped and retracted cephalad. Adhesions were lysed bluntly. The infundibulum was grasped and retracted laterally, exposing the peritoneum overlying the triangle of Calot. This was then divided and exposed in a blunt fashion. A critical view of the cystic duct and cystic artery was obtained.  The cystic duct was clearly identified and bluntly dissected.   The cystic lymphatics were doubly clipped and divided area the hepatic artery was noted to lie over the cystic duct and a branch of this going to the gallbladder, the cystic artery was doubly clipped and divided. This allowed for good visualization of the cystic duct as it entered the infundibulum. It was noted that the cystic duct was extremely small in nature and no cholangiogram could be obtained. The cystic duct was doubly clipped and divided.  The gallbladder was taken from the gallbladder fossa in a retrograde fashion with the electrocautery. The gallbladder was removed and placed in an Endocatch bag. The epigastric port site required enlargement due to the multiple large stones that were identified. The liver bed was irrigated and inspected. Hemostasis was achieved with the electrocautery. Copious irrigation was utilized and was repeatedly aspirated until clear.  The gallbladder and Endocatch sac were then removed through the epigastric port site.   Inspection of the right upper quadrant was performed. No bleeding, bile duct injury or leak, or bowel injury was noted. Pneumoperitoneum was released.  The epigastric port site was closed with multiple figure-of-eight 0 Vicryl sutures. 4-0 subcuticular Monocryl was used to close the skin except at the epigastric site where 3-0 nylon horizontal mattress sutures were placed due to bleeding  at the skin edges. Steristrips and Mastisol and sterile dressings  were  applied.  The patient was then extubated and brought to the recovery room in stable condition. Sponge, lap, and needle counts were correct at closure and at the conclusion of the case.   Findings: Cholecystitis , acute  Estimated Blood Loss: Minimal         Drains: None         Specimens: Gallbladder           Complications: none               Eara Burruel E. Burt Knack, MD, FACS

## 2014-11-03 NOTE — Anesthesia Postprocedure Evaluation (Signed)
  Anesthesia Post-op Note  Patient: Richard Bray  Procedure(s) Performed: Procedure(s): Laparoscopic cholecystectomy  (N/A)  Anesthesia type:General  Patient location: PACU  Post pain: Pain level controlled  Post assessment: Post-op Vital signs reviewed, Patient's Cardiovascular Status Stable, Respiratory Function Stable, Patent Airway and No signs of Nausea or vomiting  Post vital signs: Reviewed and stable  Last Vitals:  Filed Vitals:   11/03/14 1115  BP: 149/88  Pulse: 73  Temp: 36.8 C  Resp: 12    Level of consciousness: awake, alert  and patient cooperative  Complications: No apparent anesthesia complications

## 2014-11-04 LAB — CBC WITH DIFFERENTIAL/PLATELET
BASOS ABS: 0 10*3/uL (ref 0–0.1)
Basophils Relative: 0 %
Eosinophils Absolute: 0 10*3/uL (ref 0–0.7)
Eosinophils Relative: 0 %
HEMATOCRIT: 34.6 % — AB (ref 40.0–52.0)
HEMOGLOBIN: 11.3 g/dL — AB (ref 13.0–18.0)
LYMPHS PCT: 17 %
Lymphs Abs: 1.8 10*3/uL (ref 1.0–3.6)
MCH: 27.5 pg (ref 26.0–34.0)
MCHC: 32.7 g/dL (ref 32.0–36.0)
MCV: 84 fL (ref 80.0–100.0)
MONO ABS: 0.8 10*3/uL (ref 0.2–1.0)
Monocytes Relative: 7 %
Neutro Abs: 8.1 10*3/uL — ABNORMAL HIGH (ref 1.4–6.5)
Neutrophils Relative %: 76 %
Platelets: 148 10*3/uL — ABNORMAL LOW (ref 150–440)
RBC: 4.12 MIL/uL — ABNORMAL LOW (ref 4.40–5.90)
RDW: 12.8 % (ref 11.5–14.5)
WBC: 10.7 10*3/uL — ABNORMAL HIGH (ref 3.8–10.6)

## 2014-11-04 LAB — COMPREHENSIVE METABOLIC PANEL
ALT: 37 U/L (ref 17–63)
AST: 41 U/L (ref 15–41)
Albumin: 3.5 g/dL (ref 3.5–5.0)
Alkaline Phosphatase: 107 U/L (ref 38–126)
Anion gap: 9 (ref 5–15)
BUN: 8 mg/dL (ref 6–20)
CALCIUM: 8.4 mg/dL — AB (ref 8.9–10.3)
CO2: 24 mmol/L (ref 22–32)
CREATININE: 0.86 mg/dL (ref 0.61–1.24)
Chloride: 105 mmol/L (ref 101–111)
GFR calc Af Amer: >60 mL/min (ref >60)
GLUCOSE: 276 mg/dL — AB (ref 65–99)
Potassium: 3.9 mmol/L (ref 3.5–5.1)
SODIUM: 138 mmol/L (ref 135–145)
Total Bilirubin: 1.5 mg/dL — ABNORMAL HIGH (ref 0.3–1.2)
Total Protein: 5.5 g/dL — ABNORMAL LOW (ref 6.5–8.1)

## 2014-11-04 LAB — GLUCOSE, CAPILLARY
GLUCOSE-CAPILLARY: 200 mg/dL — AB (ref 65–99)
Glucose-Capillary: 301 mg/dL — ABNORMAL HIGH (ref 65–99)
Glucose-Capillary: 292 mg/dL — ABNORMAL HIGH (ref 65–99)

## 2014-11-04 MED ORDER — SODIUM CHLORIDE 0.9 % IJ SOLN
3.0000 mL | Freq: Three times a day (TID) | INTRAMUSCULAR | Status: DC
  Administered 2014-11-04: 08:00:00 3 mL via INTRAVENOUS

## 2014-11-04 MED ORDER — HYDROCODONE-ACETAMINOPHEN 5-325 MG PO TABS: 1.0000 | ORAL_TABLET | Freq: Four times a day (QID) | ORAL | Status: DC | PRN

## 2014-11-04 MED ORDER — LEVOFLOXACIN 500 MG PO TABS: 500.0000 mg | ORAL_TABLET | Freq: Every day | ORAL | Status: AC

## 2014-11-04 NOTE — Progress Notes (Signed)
Reports tolerated lunch well. Up walking in all hall and ad lib in room. Reports feeling well and ready to go hom.e

## 2014-11-04 NOTE — Progress Notes (Signed)
1 Day Post-Op  Subjective: Status post laparoscopic cholecystectomy. Patient feeling much better with much less pain and no nausea or vomiting.  Objective: Vital signs in last 24 hours: Temp:  [98.3 F (36.8 C)-99 F (37.2 C)] 98.5 F (36.9 C) (06/18 0446) Pulse Rate:  [69-131] 71 (06/18 0446) Resp:  [11-18] 18 (06/18 0446) BP: (121-150)/(79-92) 136/85 mmHg (06/18 0446) SpO2:  [99 %-100 %] 100 % (06/18 0446) Last BM Date: 11/02/14  Intake/Output from previous day: 06/17 0701 - 06/18 0700 In: 2868 [I.V.:2868] Out: 2260 [Urine:2250; Blood:10] Intake/Output this shift:    Physical exam:  Wounds healing well no erythema no drainage nontender abdomen.  Lab Results: CBC   Recent Labs  11/03/14 0501 11/04/14 0512  WBC 9.2 10.7*  HGB 11.0* 11.3*  HCT 33.1* 34.6*  PLT 155 148*   BMET  Recent Labs  11/03/14 0501 11/03/14 1509 11/04/14 0512  NA 140  --  138  K 3.0* 3.1* 3.9  CL 107  --  105  CO2 24  --  24  GLUCOSE 106*  --  276*  BUN 7  --  8  CREATININE 0.80  --  0.86  CALCIUM 8.4*  --  8.4*   PT/INR No results for input(s): LABPROT, INR in the last 72 hours. ABG No results for input(s): PHART, HCO3 in the last 72 hours.  Invalid input(s): PCO2, PO2  Studies/Results: Mr Lambert Mody Cm/mrcp  11/02/2014   CLINICAL DATA:  Abdominal pain. Nausea and vomiting. Symptoms started on 10/30/2014  EXAM: MRI ABDOMEN WITHOUT AND WITH CONTRAST (INCLUDING MRCP)  TECHNIQUE: Multiplanar multisequence MR imaging of the abdomen was performed both before and after the administration of intravenous contrast. Heavily T2-weighted images of the biliary and pancreatic ducts were obtained, and three-dimensional MRCP images were rendered by post processing.  CONTRAST:  4mL MULTIHANCE GADOBENATE DIMEGLUMINE 529 MG/ML IV SOLN  COMPARISON:  11/01/2014  FINDINGS: Despite efforts by the technologist and patient, motion artifact is present on today's exam and could not be eliminated. This reduces  exam sensitivity and specificity.  Lower chest:  Unremarkable  Hepatobiliary: Numerous gallstones fill the gallbladder. One of these posteriorly is larger, measuring 2.5 cm in long axis, or is most of the other gallstones are small.  No biliary dilatation or compelling findings of choledocholithiasis.  Unfortunately the top of the liver was excluded on all axial images.  Multiple hepatic hemangiomas are present. These include a 1.8 cm lesion posteriorly in segment 7 of the liver (image 40, series 14) and a 1.1 cm hemangioma in the left hepatic lobe on image 20 series 14. These lesions demonstrate delayed enhancement and high precontrast T2 signal intensity.  Pancreas: Unremarkable  Spleen: Unremarkable  Adrenals/Urinary Tract: Unremarkable  Stomach/Bowel: Unremarkable  Vascular/Lymphatic: Unremarkable  Other: Trace free fluid along the inferior margin of the right hepatic lobe.  Musculoskeletal: Unremarkable  IMPRESSION: 1. Numerous gallstones fill in the gallbladder. Most of these are small but there is a larger gallstone measuring 2.5 cm in long axis diameter. 2. Multiple hepatic hemangiomas. 3. Incidental trace free fluid along the inferior margin of the right hepatic lobe.   Electronically Signed   By: Van Clines M.D.   On: 11/02/2014 10:00    Anti-infectives: Anti-infectives    Start     Dose/Rate Route Frequency Ordered Stop   11/03/14 1800  levofloxacin (LEVAQUIN) tablet 500 mg     500 mg Oral Daily 11/02/14 1630 11/08/14 0959   11/01/14 1700  levofloxacin (LEVAQUIN) tablet  250 mg  Status:  Discontinued     250 mg Oral Daily 11/01/14 1658 11/02/14 1630      Assessment/Plan: s/p Procedure(s): Laparoscopic cholecystectomy    LFTs are normal. Will advance diet today and patient can likely go home this afternoon per internal medicine. Patient will follow up in our office in 10 days.Florene Glen, MD, FACS  11/04/2014

## 2014-11-04 NOTE — Discharge Summary (Signed)
[facility] NAME: Richard Bray    MR#:  [id number]  DATE OF BIRTH:  Mar 24, 1981  DATE OF ADMISSION:  10/31/2014 ADMITTING PHYSICIAN: Juluis Mire, MD  DATE OF DISCHARGE:11/04/2014  PRIMARY CARE PHYSICIAN: Lucilla Lame, MD    ADMISSION DIAGNOSIS:  Diabetic ketoacidosis without coma associated with type 1 diabetes mellitus [E10.10]  DISCHARGE DIAGNOSIS:  Principal Problem:   DKA, type 1 Active Problems:   Type 1 diabetes mellitus   Nausea & vomiting   DM (diabetes mellitus)   Cholecystitis   Right upper quadrant pain  s/p cholecystectomy 11/03/14.  SECONDARY DIAGNOSIS:   Past Medical History  Diagnosis Date  . Type 1 diabetes mellitus     Diagnosed at 34 yo with DKA / ICU admission  . Diabetes mellitus     HOSPITAL COURSE:   * DKA  Resolved now with Insulin drip,   transit to basal and bolus insulin.  Appreciated Consult Dr. Gabriel Carina as he have fluctuating schedule including night shift, to manage his timing of     Insulin- she is already working to get insulin pump for him.  Blood sugar is better controlled now.  * Abdominal pain- gall stone.  Acute cholecystitis ( per surgical note)  And nausea  Persist tenderness and guarding  Reviewed CT abdomen- called GI- he suggested MRCP and did endoscopy.  Endoscopy is negative, pt have multiple gall bladder stones- on 11/03/14- lap cholecystectomy done.  On levaquin. Follow in surgery clinic in 10 days.  * leukocytosis  UA negative, reviewed CT abdomen.  No fever, no need for Abx for now- it may also be due to DKA.   DISCHARGE CONDITIONS:   Stable.  CONSULTS OBTAINED:  Treatment Team:  Judi Cong, MD  DRUG ALLERGIES:   Allergies  Allergen Reactions  . Metformin Other (See Comments)    GI Upset    DISCHARGE MEDICATIONS:   Current Discharge Medication List    START taking these medications   Details   HYDROcodone-acetaminophen (NORCO/VICODIN) 5-325 MG per tablet Take 1 tablet by mouth every 6 (six) hours as needed for moderate pain. Qty: 15 tablet, Refills: 0    levofloxacin (LEVAQUIN) 500 MG tablet Take 1 tablet (500 mg total) by mouth daily. Qty: 3 tablet, Refills: 0      CONTINUE these medications which have NOT CHANGED   Details  acetone, urine, test strip 1 strip by Does not apply route as needed. Qty: 25 each, Refills: 0    HUMALOG KWIKPEN 100 UNIT/ML KiwkPen Inject 1-15 Units into the skin 3 (three) times daily. Sliding scale    LEVEMIR FLEXTOUCH 100 UNIT/ML Pen 24 Units at bedtime.    lisinopril (PRINIVIL,ZESTRIL) 20 MG tablet Take 20 mg by mouth daily.      ondansetron (ZOFRAN) 4 MG tablet Take 1 tablet (4 mg total) by mouth daily as needed for nausea or vomiting. Qty: 10 tablet, Refills: 1      STOP taking these medications     insulin aspart (NOVOLOG) 100 UNIT/ML injection      insulin aspart (NOVOLOG) 100 UNIT/ML injection      insulin glargine (LANTUS) 100 UNIT/ML injection          DISCHARGE INSTRUCTIONS:    Follow in surgical clinic 10 days.  If you experience worsening of your admission symptoms, develop shortness of breath, life threatening emergency, suicidal or homicidal thoughts you must seek medical attention immediately by calling 911 or calling your  MD immediately  if symptoms less severe.  You Must read complete instructions/literature along with all the possible adverse reactions/side effects for all the Medicines you take and that have been prescribed to you. Take any new Medicines after you have completely understood and accept all the possible adverse reactions/side effects.   Please note  You were cared for by a hospitalist during your hospital stay. If you have any questions about your discharge medications or the care you received while you were in the hospital after you are discharged, you can call the unit and asked to speak with the  hospitalist on call if the hospitalist that took care of you is not available. Once you are discharged, your primary care physician will handle any further medical issues. Please note that NO REFILLS for any discharge medications will be authorized once you are discharged, as it is imperative that you return to your primary care physician (or establish a relationship with a primary care physician if you do not have one) for your aftercare needs so that they can reassess your need for medications and monitor your lab values.    Today   CHIEF COMPLAINT:   Chief Complaint  Patient presents with  . Abdominal Pain    HISTORY OF PRESENT ILLNESS:  [name]  is a 34 y.o. male with a known history of type 1 diabetes mellitus presents to the emergency room with the complaints of nausea vomiting with associated abdominal pain intermittently for the past 2 days. This has been his third visit to the ED in the last 2 days with the same complaints. Denies any fever, chills, chest pain, shortness of breath, cough, dysuria. In the emergency room patient was evaluated by the ED physician and noted to be with stable vital signs and afebrile. Workup revealed blood sugars of 217, serum bicarbonate of 15, anion gap of 19, PH on VBG 7.22. WBC elevated at 19.9. Chest x-ray negative for any acute cardiopulmonary process. EKG normal sinus rhythm with ventricular rate of 85 bpm, no acute ST-T changes. Patient was given IV fluids normal saline and started on insulin drip and hospitalist service was consulted for further management. Patient also received IV Zofran and currently abdominal symptoms are under control and states his nausea and vomiting and abdominal pain are under control.   VITAL SIGNS:  Blood pressure 136/85, pulse 71, temperature 98.5 F (36.9 C), temperature source Oral, resp. rate 18, height 6' (1.829 m), weight 74.844 kg (165 lb), SpO2 100 %.  I/O:   Intake/Output Summary (Last 24 hours) at  11/04/14 1032 Last data filed at 11/04/14 0700  Gross per 24 hour  Intake   2868 ml  Output   2260 ml  Net    608 ml    PHYSICAL EXAMINATION:   GENERAL: 34 y.o.-year-old patient lying in the bed with no acute distress.  EYES: Pupils equal, round, reactive to light and accommodation. No scleral icterus. Extraocular muscles intact.  HEENT: Head atraumatic, normocephalic. Oropharynx and nasopharynx clear.  NECK: Supple, no jugular venous distention. No thyroid enlargement, no tenderness.  LUNGS: Normal breath sounds bilaterally, no wheezing, rales,rhonchi or crepitation. No use of accessory muscles of respiration.  CARDIOVASCULAR: S1, S2 normal. No murmurs, rubs, or gallops.  ABDOMEN: Soft, tender on right upper and lower quadrant and guarding present , nondistended. Bowel sounds present. No organomegaly or mass. Dressing after sx present. EXTREMITIES: No pedal edema, cyanosis, or clubbing.  NEUROLOGIC: Cranial nerves II through XII are intact. Muscle strength  5/5 in all extremities. Sensation intact. Gait not checked.  PSYCHIATRIC: The patient is alert and oriented x 3.  SKIN: No obvious rash, lesion, or ulcer.   DATA REVIEW:   CBC  Recent Labs Lab 11/04/14 0512  WBC 10.7*  HGB 11.3*  HCT 34.6*  PLT 148*    Chemistries   Recent Labs Lab 11/03/14 1509 11/04/14 0512  NA  --  138  K 3.1* 3.9  CL  --  105  CO2  --  24  GLUCOSE  --  276*  BUN  --  8  CREATININE  --  0.86  CALCIUM  --  8.4*  MG 1.7  --   AST  --  41  ALT  --  37  ALKPHOS  --  107  BILITOT  --  1.5*    Cardiac Enzymes No results for input(s): TROPONINI in the last 168 hours.  Microbiology Results  Results for orders placed or performed during the hospital encounter of 10/31/14  Urine culture     Status: None   Collection Time: 10/31/14 10:46 PM  Result Value Ref Range Status   Specimen Description URINE, CLEAN CATCH  Final   Special Requests NONE  Final   Culture NO GROWTH 2 DAYS   Final   Report Status 11/03/2014 FINAL  Final  Culture, blood (routine x 2)     Status: None (Preliminary result)   Collection Time: 11/01/14  6:50 AM  Result Value Ref Range Status   Specimen Description BLOOD  Final   Special Requests NONE  Final   Culture NO GROWTH 3 DAYS  Final   Report Status PENDING  Incomplete    RADIOLOGY:  No results found.     Management plans discussed with the patient, family and they are in agreement.  CODE STATUS:     Code Status Orders        Start     Ordered   11/01/14 0223  Full code   Continuous     11/01/14 0222      TOTAL TIME TAKING CARE OF THIS PATIENT: 35 minutes.    Vaughan Basta M.D on 11/04/2014 at 10:32 AM  Between 7am to 6pm - Pager - [phone]  After 6pm go to [email or web address] - password [email or web address] [facility]  Office  [phone]  CC: Primary care physician; Lucilla Lame, MD

## 2014-11-04 NOTE — Progress Notes (Signed)
MD order received to discharge pt home today; verbally reviewed AVS with pt including medications/gave Rxs to pt; diet and follow up appointment/pt to call Dr Antionette Char office on 11/06/14 and get follow up appointment for 1 week and return to work date; no further questions voiced at this time; pt's discharge pending arrival of his ride home

## 2014-11-04 NOTE — Plan of Care (Signed)
Problem: Discharge Progression Outcomes Goal: Other Discharge Outcomes/Goals Outcome: Progressing Patient is alert and oriented, independent. C/o abdominal pain, norco and morphine given with relief. Dressing is dry and intact.

## 2014-11-06 LAB — SURGICAL PATHOLOGY

## 2014-11-06 LAB — CULTURE, BLOOD (ROUTINE X 2): CULTURE: NO GROWTH

## 2014-11-10 ENCOUNTER — Ambulatory Visit (INDEPENDENT_AMBULATORY_CARE_PROVIDER_SITE_OTHER): Payer: No Typology Code available for payment source | Admitting: Surgery

## 2014-11-10 ENCOUNTER — Encounter: Payer: Self-pay | Admitting: Surgery

## 2014-11-10 VITALS — BP 113/75 | HR 84 | Temp 98.5°F | Ht 72.0 in | Wt 148.8 lb

## 2014-11-10 DIAGNOSIS — Z09 Encounter for follow-up examination after completed treatment for conditions other than malignant neoplasm: Secondary | ICD-10-CM

## 2014-11-10 MED ORDER — HYDROCODONE-ACETAMINOPHEN 5-325 MG PO TABS: 1.0000 | ORAL_TABLET | Freq: Four times a day (QID) | ORAL | Status: DC | PRN

## 2014-11-10 NOTE — Patient Instructions (Signed)
You may take OTC Miralax per instructions on the bottle. Call our officefor further instructions if you are still having difficulty with bowel movements after taking Miralax for 48 hours.  Increasing water intake and fiber will also help with bowel movements and bloating feeling.  May return to work but cannot take narcotic pain medication while working. See work note provided.

## 2014-11-10 NOTE — Progress Notes (Signed)
S: Doing well.  Some pain, controlled with norco.  Tolerating PO.  Constipated.    O Blood pressure 113/75, pulse 84, temperature 98.5 F (36.9 C), temperature source Oral, height 6' (1.829 m), weight 67.495 kg (148 lb 12.8 oz). GEN: NAD/A&Ox3 ABD: soft, min tender, nondistended  A/P 34 yo s/p lap chole x 1 week.  Doing well.  No acute issues - miralax for constipation - rx for norco x #20

## 2014-11-13 ENCOUNTER — Encounter: Payer: Self-pay | Admitting: Gastroenterology

## 2017-09-26 ENCOUNTER — Emergency Department
Admission: EM | Admit: 2017-09-26 | Discharge: 2017-09-26 | Disposition: A | Discharge status: Home / Self Care | Payer: No Typology Code available for payment source | Attending: Emergency Medicine | Admitting: Emergency Medicine

## 2017-09-26 ENCOUNTER — Other Ambulatory Visit: Payer: Self-pay

## 2017-09-26 ENCOUNTER — Encounter: Payer: Self-pay | Admitting: Emergency Medicine

## 2017-09-26 DIAGNOSIS — K529 Noninfective gastroenteritis and colitis, unspecified: Secondary | ICD-10-CM | POA: Insufficient documentation

## 2017-09-26 DIAGNOSIS — Z794 Long term (current) use of insulin: Secondary | ICD-10-CM | POA: Insufficient documentation

## 2017-09-26 DIAGNOSIS — Z79899 Other long term (current) drug therapy: Secondary | ICD-10-CM | POA: Insufficient documentation

## 2017-09-26 DIAGNOSIS — R112 Nausea with vomiting, unspecified: Secondary | ICD-10-CM

## 2017-09-26 DIAGNOSIS — E109 Type 1 diabetes mellitus without complications: Secondary | ICD-10-CM | POA: Insufficient documentation

## 2017-09-26 LAB — CBC
HCT: 40.6 % (ref 40.0–52.0)
Hemoglobin: 13.7 g/dL (ref 13.0–18.0)
MCH: 28.4 pg (ref 26.0–34.0)
MCHC: 33.7 g/dL (ref 32.0–36.0)
MCV: 84.1 fL (ref 80.0–100.0)
PLATELETS: 257 10*3/uL (ref 150–440)
RBC: 4.83 MIL/uL (ref 4.40–5.90)
RDW: 13.3 % (ref 11.5–14.5)
WBC: 14.7 10*3/uL — AB (ref 3.8–10.6)

## 2017-09-26 LAB — URINALYSIS, COMPLETE (UACMP) WITH MICROSCOPIC
BILIRUBIN URINE: NEGATIVE
Bacteria, UA: NONE SEEN
Glucose, UA: 50 mg/dL — AB
Hgb urine dipstick: NEGATIVE
KETONES UR: 80 mg/dL — AB
LEUKOCYTES UA: NEGATIVE
NITRITE: NEGATIVE
PH: 5 (ref 5.0–8.0)
Protein, ur: 30 mg/dL — AB
SPECIFIC GRAVITY, URINE: 1.014 (ref 1.005–1.030)
Squamous Epithelial / LPF: NONE SEEN (ref 0–5)

## 2017-09-26 LAB — COMPREHENSIVE METABOLIC PANEL
ALK PHOS: 222 U/L — AB (ref 38–126)
ALT: 35 U/L (ref 17–63)
AST: 36 U/L (ref 15–41)
Albumin: 5.1 g/dL — ABNORMAL HIGH (ref 3.5–5.0)
Anion gap: 18 — ABNORMAL HIGH (ref 5–15)
BUN: 18 mg/dL (ref 6–20)
CHLORIDE: 102 mmol/L (ref 101–111)
CO2: 21 mmol/L — ABNORMAL LOW (ref 22–32)
Calcium: 9.5 mg/dL (ref 8.9–10.3)
Creatinine, Ser: 1.05 mg/dL (ref 0.61–1.24)
GFR calc Af Amer: >60 mL/min (ref >60)
GFR calc non Af Amer: >60 mL/min (ref >60)
Glucose, Bld: 132 mg/dL — ABNORMAL HIGH (ref 65–99)
Potassium: 3.7 mmol/L (ref 3.5–5.1)
Sodium: 141 mmol/L (ref 135–145)
Total Bilirubin: 1.4 mg/dL — ABNORMAL HIGH (ref 0.3–1.2)
Total Protein: 8.3 g/dL — ABNORMAL HIGH (ref 6.5–8.1)

## 2017-09-26 LAB — GLUCOSE, CAPILLARY: Glucose-Capillary: 116 mg/dL — ABNORMAL HIGH (ref 65–99)

## 2017-09-26 LAB — LIPASE, BLOOD: LIPASE: 17 U/L (ref 11–51)

## 2017-09-26 MED ORDER — ONDANSETRON HCL 4 MG/2ML IJ SOLN
4.0000 mg | Freq: Once | INTRAMUSCULAR | Status: AC | PRN
  Administered 2017-09-26: 4 mg via INTRAVENOUS
  Filled 2017-09-26: qty 2

## 2017-09-26 MED ORDER — SODIUM CHLORIDE 0.9 % IV BOLUS
1000.0000 mL | Freq: Once | INTRAVENOUS | Status: AC
  Administered 2017-09-26: 1000 mL via INTRAVENOUS

## 2017-09-26 MED ORDER — DICYCLOMINE HCL 20 MG PO TABS: 20.0000 mg | ORAL_TABLET | Freq: Three times a day (TID) | ORAL | 0 refills | Status: DC | PRN

## 2017-09-26 MED ORDER — ONDANSETRON HCL 4 MG PO TABS: 4.0000 mg | ORAL_TABLET | Freq: Three times a day (TID) | ORAL | 0 refills | Status: DC | PRN

## 2017-09-26 NOTE — ED Provider Notes (Signed)
[facility] Emergency Department Provider Note  ____________________________________________   I have reviewed the triage vital signs and the nursing notes.   HISTORY  Chief Complaint Nausea, vomiting and diarrhea  History limited by: Not Limited   HPI JUJUAN DUGO is a 37 y.o. male who presents to the emergency department today because of concern for nausea vomiting and diarrhea. The patient states that these symptoms started this morning. The patient has had multiple episodes of nausea and diarrhea. The patient had accompanying abdominal pain. Patient states that by the time of my exam he is feeling better, had received fluid bolus and zofran. Patient's wife was recently sick with similar symptoms.    Per medical record review patient has a history of type 1 DM.  Past Medical History:  Diagnosis Date  . Diabetes mellitus   . Type 1 diabetes mellitus ([place])    Diagnosed at 37 yo with DKA / ICU admission    Patient Active Problem List   Diagnosis Date Noted  . Cholecystitis   . Right upper quadrant pain   . DKA, type 1 ([place]) 10/31/2014  . Nausea & vomiting 10/31/2014  . DM (diabetes mellitus) ([place]) 10/31/2014  . Type 1 diabetes mellitus ([employer])     Past Surgical History:  Procedure Laterality Date  . CHOLECYSTECTOMY N/A 11/03/2014   Procedure: Laparoscopic cholecystectomy ;  Surgeon: Florene Glen, MD;  Location: [facility] ORS;  Service: General;  Laterality: N/A;  . ESOPHAGOGASTRODUODENOSCOPY Left 11/02/2014   Procedure: ESOPHAGOGASTRODUODENOSCOPY (EGD);  Surgeon: Hulen Luster, MD;  Location: [facility] ENDOSCOPY;  Service: Endoscopy;  Laterality: Left;    Prior to Admission medications   Medication Sig Start Date End Date Taking? Authorizing Provider  acetone, urine, test strip 1 strip by Does not apply route as needed. 05/30/11   Cherene Altes, MD  HUMALOG KWIKPEN 100 UNIT/ML KiwkPen Inject 1-15 Units into the skin 3 (three) times daily. Sliding scale  09/22/14   [provider]  HYDROcodone-acetaminophen (NORCO) 5-325 MG per tablet Take 1 tablet by mouth every 6 (six) hours as needed for moderate pain or severe pain. 11/10/14   Marlyce Huge, MD  HYDROcodone-acetaminophen (NORCO/VICODIN) 5-325 MG per tablet Take 1 tablet by mouth every 6 (six) hours as needed for moderate pain. 11/04/14   Vaughan Basta, MD  LEVEMIR FLEXTOUCH 100 UNIT/ML Pen 24 Units at bedtime. 08/09/14   [provider]  lisinopril (PRINIVIL,ZESTRIL) 20 MG tablet Take 20 mg by mouth daily.      [provider]  ondansetron (ZOFRAN) 4 MG tablet Take 1 tablet (4 mg total) by mouth daily as needed for nausea or vomiting. 10/31/14   Clearnce Hasten Randall An, MD    Allergies Metformin  Family History  Problem Relation Age of Onset  . Sickle cell anemia Father   . Aneurysm Mother        Cerebral     Social History Social History   Tobacco Use  . Smoking status: Never Smoker  . Smokeless tobacco: Never Used  Substance Use Topics  . Alcohol use: No  . Drug use: Yes    Types: Marijuana    Comment: Marijuana every other day     Review of Systems Constitutional: No fever/chills Eyes: No visual changes. ENT: No sore throat. Cardiovascular: Denies chest pain. Respiratory: Denies shortness of breath. Gastrointestinal: Positive for abdominal pain, nausea, vomiting and diarrhea. Genitourinary: Negative for dysuria. Musculoskeletal: Negative for back pain. Skin: Negative for rash. Neurological: Negative for headaches, focal  weakness or numbness.  ____________________________________________   PHYSICAL EXAM:  VITAL SIGNS: ED Triage Vitals  Enc Vitals Group     BP 09/26/17 1416 124/87     Pulse Rate 09/26/17 1416 (!) 110     Resp 09/26/17 1416 18     Temp 09/26/17 1416 98.1 F (36.7 C)     Temp Source 09/26/17 1416 Oral     SpO2 09/26/17 1416 98 %     Weight 09/26/17 1416 165 lb (74.8 kg)     Height 09/26/17 1416 6'  (1.829 m)     Head Circumference --      Peak Flow --      Pain Score 09/26/17 1424 7   Constitutional: Alert and oriented. Well appearing and in no distress. Eyes: Conjunctivae are normal.  ENT   Head: Normocephalic and atraumatic.   Nose: No congestion/rhinnorhea.   Mouth/Throat: Mucous membranes are moist.   Neck: No stridor. Hematological/Lymphatic/Immunilogical: No cervical lymphadenopathy. Cardiovascular: Normal rate, regular rhythm.  No murmurs, rubs, or gallops.  Respiratory: Normal respiratory effort without tachypnea nor retractions. Breath sounds are clear and equal bilaterally. No wheezes/rales/rhonchi. Gastrointestinal: Soft and non tender. No rebound. No guarding.  Genitourinary: Deferred Musculoskeletal: Normal range of motion in all extremities. No lower extremity edema. Neurologic:  Normal speech and language. No gross focal neurologic deficits are appreciated.  Skin:  Skin is warm, dry and intact. No rash noted. Psychiatric: Mood and affect are normal. Speech and behavior are normal. Patient exhibits appropriate insight and judgment.  ____________________________________________    LABS (pertinent positives/negatives)  Lipase 17 CMP na 141, k 3.7, glu 132, cr 1.05 CBC wbc 14.7, hgb 13.7, plt 257   ____________________________________________   EKG  None  ____________________________________________    RADIOLOGY  None  ____________________________________________   PROCEDURES  Procedures  ____________________________________________   INITIAL IMPRESSION / ASSESSMENT AND PLAN / ED COURSE  Pertinent labs & imaging results that were available during my care of the patient were reviewed by me and considered in my medical decision making (see chart for details).  Patient presented to the emergency department today because of concerns for nausea vomiting and diarrhea.  Symptoms started this morning.  On exam patient no acute  distress.  His abdomen is benign.  At this point differential would include gastroenteritis, food poisoning, diverticulitis, appendicitis, DKA amongst other etiologies.  Patient does have a slight anion gap as well as some ketones in urine however glucose levels are not significantly elevated.  Additionally patient appears quite well.  At this point extremely low suspicion that these findings correspond with DKA.  Think much more likely patient suffering from gastroenteritis especially given setting of wife who had similar symptoms.  Patient was able to tolerate p.o. here.  Did discuss with patient importance of close monitoring of blood sugar over the next couple of days.   ____________________________________________   FINAL CLINICAL IMPRESSION(S) / ED DIAGNOSES  Final diagnoses:  Gastroenteritis  Nausea and vomiting, intractability of vomiting not specified, unspecified vomiting type     Note: This dictation was prepared with Dragon dictation. Any transcriptional errors that result from this process are unintentional    Nance Pear, MD 09/26/17 1718

## 2017-09-26 NOTE — Discharge Instructions (Addendum)
As we discussed it is very important that you keep a close check on your sugar levels over the next couple of days. It can go either very low (if you are not eating) or very high (due to illness). Please seek medical attention for any high fevers, chest pain, shortness of breath, change in behavior, persistent vomiting, bloody stool or any other new or concerning symptoms.

## 2017-09-26 NOTE — ED Triage Notes (Signed)
Woke up this morning at around 0530 vomiting.  Patient states he generally feels bad.  Unable to keep anything down. CBG at home was 80.  Patient has taken 22 units of Novolog at 0700 (CBG read hight prior to dose).  Levimir 30 units taken at 0800.

## 2020-09-04 LAB — HM HEPATITIS C SCREENING LAB: HM Hepatitis Screen: NEGATIVE

## 2021-07-03 DIAGNOSIS — E1065 Type 1 diabetes mellitus with hyperglycemia: Secondary | ICD-10-CM | POA: Diagnosis not present

## 2021-07-10 DIAGNOSIS — E1065 Type 1 diabetes mellitus with hyperglycemia: Secondary | ICD-10-CM | POA: Diagnosis not present

## 2021-10-08 DIAGNOSIS — E103293 Type 1 diabetes mellitus with mild nonproliferative diabetic retinopathy without macular edema, bilateral: Secondary | ICD-10-CM | POA: Diagnosis not present

## 2021-10-08 DIAGNOSIS — E1065 Type 1 diabetes mellitus with hyperglycemia: Secondary | ICD-10-CM | POA: Diagnosis not present

## 2021-10-08 DIAGNOSIS — Z794 Long term (current) use of insulin: Secondary | ICD-10-CM | POA: Diagnosis not present

## 2021-12-17 DIAGNOSIS — Z20822 Contact with and (suspected) exposure to covid-19: Secondary | ICD-10-CM | POA: Diagnosis not present

## 2021-12-17 DIAGNOSIS — K529 Noninfective gastroenteritis and colitis, unspecified: Secondary | ICD-10-CM | POA: Diagnosis not present

## 2021-12-17 DIAGNOSIS — U071 COVID-19: Secondary | ICD-10-CM | POA: Diagnosis not present

## 2022-01-02 DIAGNOSIS — J019 Acute sinusitis, unspecified: Secondary | ICD-10-CM | POA: Diagnosis not present

## 2022-01-02 DIAGNOSIS — R1013 Epigastric pain: Secondary | ICD-10-CM | POA: Diagnosis not present

## 2022-01-02 DIAGNOSIS — K5900 Constipation, unspecified: Secondary | ICD-10-CM | POA: Diagnosis not present

## 2022-01-02 DIAGNOSIS — R110 Nausea: Secondary | ICD-10-CM | POA: Diagnosis not present

## 2022-02-04 DIAGNOSIS — Z9641 Presence of insulin pump (external) (internal): Secondary | ICD-10-CM | POA: Diagnosis not present

## 2022-02-04 DIAGNOSIS — E103293 Type 1 diabetes mellitus with mild nonproliferative diabetic retinopathy without macular edema, bilateral: Secondary | ICD-10-CM | POA: Diagnosis not present

## 2022-02-04 DIAGNOSIS — E1065 Type 1 diabetes mellitus with hyperglycemia: Secondary | ICD-10-CM | POA: Diagnosis not present

## 2022-04-18 LAB — HM DIABETES EYE EXAM

## 2022-04-28 DIAGNOSIS — H2513 Age-related nuclear cataract, bilateral: Secondary | ICD-10-CM | POA: Diagnosis not present

## 2022-04-29 DIAGNOSIS — E109 Type 1 diabetes mellitus without complications: Secondary | ICD-10-CM | POA: Diagnosis not present

## 2022-04-29 LAB — BASIC METABOLIC PANEL
BUN: 16 (ref 4–21)
Creatinine: 0.9 (ref 0.6–1.3)

## 2022-04-29 LAB — MICROALBUMIN, URINE: Microalb, Ur: 171.4

## 2022-04-29 LAB — PROTEIN / CREATININE RATIO, URINE
Albumin, U: 8
Creatinine, Urine: 4.7

## 2022-05-06 DIAGNOSIS — E103293 Type 1 diabetes mellitus with mild nonproliferative diabetic retinopathy without macular edema, bilateral: Secondary | ICD-10-CM | POA: Diagnosis not present

## 2022-05-06 DIAGNOSIS — Z9641 Presence of insulin pump (external) (internal): Secondary | ICD-10-CM | POA: Diagnosis not present

## 2022-05-06 DIAGNOSIS — E109 Type 1 diabetes mellitus without complications: Secondary | ICD-10-CM | POA: Diagnosis not present

## 2022-05-15 ENCOUNTER — Encounter: Payer: Self-pay | Admitting: Emergency Medicine

## 2022-05-15 ENCOUNTER — Other Ambulatory Visit: Payer: Self-pay

## 2022-05-15 ENCOUNTER — Emergency Department
Admission: EM | Admit: 2022-05-15 | Discharge: 2022-05-15 | Disposition: A | Discharge status: Home / Self Care | Payer: BLUE CROSS/BLUE SHIELD | Attending: Emergency Medicine | Admitting: Emergency Medicine

## 2022-05-15 DIAGNOSIS — S6992XA Unspecified injury of left wrist, hand and finger(s), initial encounter: Secondary | ICD-10-CM | POA: Diagnosis not present

## 2022-05-15 DIAGNOSIS — S61212A Laceration without foreign body of right middle finger without damage to nail, initial encounter: Secondary | ICD-10-CM | POA: Diagnosis not present

## 2022-05-15 DIAGNOSIS — S61213A Laceration without foreign body of left middle finger without damage to nail, initial encounter: Secondary | ICD-10-CM | POA: Insufficient documentation

## 2022-05-15 DIAGNOSIS — Y990 Civilian activity done for income or pay: Secondary | ICD-10-CM | POA: Insufficient documentation

## 2022-05-15 DIAGNOSIS — Z23 Encounter for immunization: Secondary | ICD-10-CM | POA: Insufficient documentation

## 2022-05-15 DIAGNOSIS — W25XXXA Contact with sharp glass, initial encounter: Secondary | ICD-10-CM | POA: Diagnosis not present

## 2022-05-15 DIAGNOSIS — E109 Type 1 diabetes mellitus without complications: Secondary | ICD-10-CM | POA: Insufficient documentation

## 2022-05-15 MED ORDER — CEPHALEXIN 500 MG PO CAPS: 1000.0000 mg | ORAL_CAPSULE | Freq: Two times a day (BID) | ORAL | 0 refills | Status: AC

## 2022-05-15 MED ORDER — TETANUS-DIPHTH-ACELL PERTUSSIS 5-2.5-18.5 LF-MCG/0.5 IM SUSY
0.5000 mL | PREFILLED_SYRINGE | Freq: Once | INTRAMUSCULAR | Status: AC
  Administered 2022-05-15: 0.5 mL via INTRAMUSCULAR

## 2022-05-15 MED ORDER — TETANUS-DIPHTH-ACELL PERTUSSIS 5-2.5-18.5 LF-MCG/0.5 IM SUSY
PREFILLED_SYRINGE | INTRAMUSCULAR | Status: AC
  Filled 2022-05-15: qty 0.5

## 2022-05-15 NOTE — ED Triage Notes (Signed)
Patient arrives ambulatory by POV with laceration to right middle finger from glass. No blood thinners.

## 2022-05-15 NOTE — ED Notes (Addendum)
See triage note. Provider BS assessing and cleansing pt's wound. Pt able to move injured finger and bleeding currently controlled. Pt ambulatory, resp reg/unlabored, skin dry. Visitor with pt.

## 2022-05-15 NOTE — Discharge Instructions (Addendum)
-  The Dermabond glue will dissolve on its own in 5 to 7 days.  Do not apply any lotions, creams, or ointments, as this will cause the glue to dissolve prematurely.  -Please take the full course of the antibiotics as prescribed.  -Return to the emergency department anytime if you begin to experience any new or worsening symptoms.

## 2022-05-15 NOTE — ED Provider Notes (Signed)
[facility] Provider Note    None    (approximate)   History   Chief Complaint Laceration   HPI Richard Bray is a 41 y.o. male, history of type 1 diabetes, presents to the emergency department for evaluation of finger laceration.  He states that he excellently cut his right middle finger on glass at work.  Denies any other injuries.  He states that he is not up-to-date on his tetanus vaccinations.  Denies fever/chills, cold sensation along the finger, numb/tingling, restless lesions, wrist pain, forearm pain, weakness, or nausea/vomiting.  History Limitations: No limitations.        Physical Exam  Triage Vital Signs: ED Triage Vitals  Enc Vitals Group     BP 05/15/22 1344 119/76     Pulse Rate 05/15/22 1344 75     Resp 05/15/22 1344 16     Temp 05/15/22 1344 98 F (36.7 C)     Temp Source 05/15/22 1344 Oral     SpO2 05/15/22 1344 94 %     Weight 05/15/22 1348 165 lb (74.8 kg)     Height 05/15/22 1348 6' (1.829 m)     Head Circumference --      Peak Flow --      Pain Score 05/15/22 1348 5     Pain Loc --      Pain Edu? --      Excl. in [place]? --     Most recent vital signs: Vitals:   05/15/22 1344 05/15/22 1632  BP: 119/76 114/82  Pulse: 75 67  Resp: 16 17  Temp: 98 F (36.7 C)   SpO2: 94% 99%    General: Awake, NAD.  Skin: Warm, dry. No rashes or lesions.  Eyes: PERRL. Conjunctivae normal.  CV: Good peripheral perfusion.  Resp: Normal effort.  Abd: Soft, non-tender. No distention.  Neuro: At baseline. No gross neurological deficits.  Musculoskeletal: Normal ROM of all extremities.  Focused Exam: 2 cm oblique superficial laceration along the palmar aspect of the middle finger between the DIP and PIP joints.  Normal flexion and extension across the entire digit.  Normal cap refill distally.  Sensation intact.  No signs of foreign bodies.  No underlying osseous tenderness.  No signs of any tendon involvement.  Physical  Exam    ED Results / Procedures / Treatments  Labs (all labs ordered are listed, but only abnormal results are displayed) Labs Reviewed - No data to display   EKG N/A.    RADIOLOGY  ED Provider Interpretation: N/A.  No results found.  PROCEDURES:  Critical Care performed: N/A.  Marland Kitchen.Laceration Repair  Date/Time: 05/15/2022 7:41 PM  Performed by: Teodoro Spray, PA Authorized by: Teodoro Spray, PA   Consent:    Consent obtained:  Verbal   Consent given by:  Patient   Risks, benefits, and alternatives were discussed: yes     Risks discussed:  Infection, pain, retained foreign body, tendon damage, poor cosmetic result, need for additional repair, nerve damage, poor wound healing and vascular damage   Alternatives discussed:  No treatment Universal protocol:    Patient identity confirmed:  Verbally with patient Anesthesia:    Anesthesia method:  None Laceration details:    Location:  Finger   Finger location:  R long finger   Length (cm):  2   Depth (mm):  1 Pre-procedure details:    Preparation:  Patient was prepped and draped in usual sterile fashion Exploration:  Hemostasis achieved with:  Direct pressure   Wound extent: no foreign body, no signs of injury, no nerve damage, no tendon damage, no underlying fracture and no vascular damage   Treatment:    Area cleansed with:  Soap and water   Amount of cleaning:  Extensive   Irrigation solution:  Tap water   Irrigation volume:  3000 ml   Irrigation method:  Tap   Debridement:  None   Undermining:  None Skin repair:    Repair method:  Tissue adhesive Approximation:    Approximation:  Close Repair type:    Repair type:  Simple Post-procedure details:    Dressing:  Sterile dressing and tube gauze   Procedure completion:  Tolerated well, no immediate complications     MEDICATIONS ORDERED IN ED: Medications  Tdap (BOOSTRIX) 5-2.5-18.5 LF-MCG/0.5 injection ( Intramuscular Not Given 05/15/22  1630)  Tdap (BOOSTRIX) injection 0.5 mL (0.5 mLs Intramuscular Given 05/15/22 1627)     IMPRESSION / MDM / ASSESSMENT AND PLAN / ED COURSE  I reviewed the triage vital signs and the nursing notes.                              Differential diagnosis includes, but is not limited to, finger laceration, tendon injury, foreign body, phalangeal fracture.  Assessment/Plan Patient presents with middle finger laceration after excellently cutting it on glass.  Provided him with a tetanus booster.  Laceration appears superficial.  No active bleeding at this time.  No signs of infection or foreign bodies.  No signs of any tendon involvement.  Very low suspicion for underlying fracture.  No imaging indicated at this time.  I was able to successfully repair it utilizing Dermabond glue.  Patient tolerated the procedure well with no immediate complications.  Provided with a prescription for cephalexin to prevent infection.  Encouraged him to take Tylenol/ibuprofen as needed for pain.  Will discharge.  Provided the patient with anticipatory guidance, return precautions, and educational material. Encouraged the patient to return to the emergency department at any time if they begin to experience any new or worsening symptoms. Patient expressed understanding and agreed with the plan.   Patient's presentation is most consistent with acute, uncomplicated illness.       FINAL CLINICAL IMPRESSION(S) / ED DIAGNOSES   Final diagnoses:  Laceration of right middle finger without foreign body without damage to nail, initial encounter     Rx / DC Orders   ED Discharge Orders          Ordered    cephALEXin (KEFLEX) 500 MG capsule  2 times daily        05/15/22 1627             Note:  This document was prepared using Dragon voice recognition software and may include unintentional dictation errors.   Teodoro Spray, [place] 05/15/22 Raye Sorrow    Delman Kitten, MD 05/16/22 [phone]

## 2022-05-15 NOTE — ED Notes (Signed)
Pt awaiting registration for d/c.

## 2022-05-15 NOTE — ED Notes (Signed)
Pt would've signed for d/c paperwork but no signature pad currently available.

## 2022-07-14 DIAGNOSIS — U071 COVID-19: Secondary | ICD-10-CM | POA: Diagnosis not present

## 2022-07-14 DIAGNOSIS — Z20822 Contact with and (suspected) exposure to covid-19: Secondary | ICD-10-CM | POA: Diagnosis not present

## 2022-07-14 DIAGNOSIS — M791 Myalgia, unspecified site: Secondary | ICD-10-CM | POA: Diagnosis not present

## 2022-07-22 DIAGNOSIS — Z9641 Presence of insulin pump (external) (internal): Secondary | ICD-10-CM | POA: Diagnosis not present

## 2022-07-22 DIAGNOSIS — E103293 Type 1 diabetes mellitus with mild nonproliferative diabetic retinopathy without macular edema, bilateral: Secondary | ICD-10-CM | POA: Diagnosis not present

## 2022-07-22 DIAGNOSIS — E1065 Type 1 diabetes mellitus with hyperglycemia: Secondary | ICD-10-CM | POA: Diagnosis not present

## 2022-07-22 LAB — HEMOGLOBIN A1C: Hemoglobin A1C: 9.1

## 2022-07-28 ENCOUNTER — Ambulatory Visit: Payer: BC Managed Care – PPO | Admitting: Internal Medicine

## 2022-07-28 ENCOUNTER — Encounter: Payer: Self-pay | Admitting: Internal Medicine

## 2022-07-28 VITALS — BP 116/60 | HR 75 | Temp 97.9°F | Resp 18 | Ht 72.0 in | Wt 171.5 lb

## 2022-07-28 DIAGNOSIS — E1065 Type 1 diabetes mellitus with hyperglycemia: Secondary | ICD-10-CM

## 2022-07-28 DIAGNOSIS — M65331 Trigger finger, right middle finger: Secondary | ICD-10-CM

## 2022-07-28 DIAGNOSIS — G4709 Other insomnia: Secondary | ICD-10-CM

## 2022-07-28 DIAGNOSIS — Z13220 Encounter for screening for lipoid disorders: Secondary | ICD-10-CM

## 2022-07-28 DIAGNOSIS — R7981 Abnormal blood-gas level: Secondary | ICD-10-CM | POA: Diagnosis not present

## 2022-07-28 LAB — CBC WITH DIFFERENTIAL/PLATELET
Absolute Monocytes: 498 cells/uL (ref 200–950)
Basophils Absolute: 87 cells/uL (ref 0–200)
Basophils Relative: 1.1 %
Eosinophils Absolute: 585 cells/uL — ABNORMAL HIGH (ref 15–500)
Eosinophils Relative: 7.4 %
HCT: 43.1 % (ref 38.5–50.0)
Hemoglobin: 14 g/dL (ref 13.2–17.1)
Lymphs Abs: 2244 cells/uL (ref 850–3900)
MCH: 27 pg (ref 27.0–33.0)
MCHC: 32.5 g/dL (ref 32.0–36.0)
MCV: 83.2 fL (ref 80.0–100.0)
MPV: 12.1 fL (ref 7.5–12.5)
Monocytes Relative: 6.3 %
Neutro Abs: 4487 cells/uL (ref 1500–7800)
Neutrophils Relative %: 56.8 %
Platelets: 219 10*3/uL (ref 140–400)
RBC: 5.18 10*6/uL (ref 4.20–5.80)
RDW: 12.7 % (ref 11.0–15.0)
Total Lymphocyte: 28.4 %
WBC: 7.9 10*3/uL (ref 3.8–10.8)

## 2022-07-28 LAB — LIPID PANEL
Cholesterol: 167 mg/dL (ref <200)
HDL: 45 mg/dL (ref >=40)
LDL Cholesterol (Calc): 103 mg/dL (calc) — ABNORMAL HIGH
Non-HDL Cholesterol (Calc): 122 mg/dL (calc) (ref <130)
Total CHOL/HDL Ratio: 3.7 (calc) (ref <5.0)
Triglycerides: 94 mg/dL (ref <150)

## 2022-07-28 NOTE — Progress Notes (Signed)
New Patient Office Visit  Subjective    Patient ID: Richard Bray, male    DOB: Oct 29, 1980  Age: 42 y.o. MRN: [id number]  CC:  Chief Complaint  Patient presents with   Establish Care    See's endo    HPI Richard Bray presents to establish care.  Diabetes, Type 1: -Following with Endocrinology at [facility], last seen 07/22/22. Following up in 3 months.  -Has Omnipod 5 insulin pump since August  -Last A1c 3/24 9.1% -Medications: basal insulin 24 units in 24 hours, bolus about 15 g/unit, Lisinopril 20 mg  -Patient is compliant with the above medications and reports no side effects.  -Checking BG at home: has Dexcom -Eye exam: UTD 12/23 -Microalbumin: UTD 3/24 -Statin: No, last LDL 96 2/23 -PNA vaccine: prevnar 23 in 2013 -Denies symptoms of hypoglycemia, polyuria, polydipsia, numbness extremities, foot ulcers/trauma.   Right Trigger Finger: -Going on the last 6 months or so - located on his right third digit -Patient is right handed, works with drills with some repetitive motions -Does have pain more so in the MCP joint -Finger can bend but will get stuck and can't unbend  Insomnia: -Works second shift -Sleeps about 4-5 hours -Has trouble falling asleep but can usually stay asleep without issue -Is working on sleep hygiene items like using black out curtains and ambient noise -Does not take any sleep aids   Health Maintenance: -Blood work due   Outpatient Encounter Medications as of 07/28/2022  Medication Sig   Continuous Blood Gluc Sensor (DEXCOM G6 SENSOR) MISC SMARTSIG:Topical Every 10 Days   Continuous Blood Gluc Transmit (DEXCOM G6 TRANSMITTER) MISC USE TO MONITOR BLOOD SUGAR. REPLACE EVERY 3 MONTHS   Insulin Disposable Pump (OMNIPOD 5 G6 PODS, GEN 5,) MISC Inject into the skin.   lisinopril (PRINIVIL,ZESTRIL) 20 MG tablet Take 10 mg by mouth daily.   NOVOLOG 100 UNIT/ML injection SMARTSIG:0-100 Unit(s) SUB-Q Daily   [DISCONTINUED] acetone, urine, test strip 1  strip by Does not apply route as needed.   [DISCONTINUED] dicyclomine (BENTYL) 20 MG tablet Take 1 tablet (20 mg total) by mouth 3 (three) times daily as needed (abdominal pain).   [DISCONTINUED] HUMALOG KWIKPEN 100 UNIT/ML KiwkPen Inject 1-15 Units into the skin 3 (three) times daily. Sliding scale   [DISCONTINUED] HYDROcodone-acetaminophen (NORCO) 5-325 MG per tablet Take 1 tablet by mouth every 6 (six) hours as needed for moderate pain or severe pain.   [DISCONTINUED] HYDROcodone-acetaminophen (NORCO/VICODIN) 5-325 MG per tablet Take 1 tablet by mouth every 6 (six) hours as needed for moderate pain.   [DISCONTINUED] LEVEMIR FLEXTOUCH 100 UNIT/ML Pen 24 Units at bedtime.   [DISCONTINUED] ondansetron (ZOFRAN) 4 MG tablet Take 1 tablet (4 mg total) by mouth daily as needed for nausea or vomiting.   [DISCONTINUED] ondansetron (ZOFRAN) 4 MG tablet Take 1 tablet (4 mg total) by mouth every 8 (eight) hours as needed for nausea or vomiting.   No facility-administered encounter medications on file as of 07/28/2022.    Past Medical History:  Diagnosis Date   Diabetes mellitus    Type 1 diabetes mellitus ([place])    Diagnosed at 42 yo with DKA / ICU admission    Past Surgical History:  Procedure Laterality Date   CHOLECYSTECTOMY N/A 11/03/2014   Procedure: Laparoscopic cholecystectomy ;  Surgeon: Florene Glen, MD;  Location: [facility] ORS;  Service: General;  Laterality: N/A;   ESOPHAGOGASTRODUODENOSCOPY Left 11/02/2014   Procedure: ESOPHAGOGASTRODUODENOSCOPY (EGD);  Surgeon: Hulen Luster, MD;  Location:  [facility];  Service: Endoscopy;  Laterality: Left;    Family History  Problem Relation Age of Onset   Sickle cell anemia Father    Aneurysm Mother        Cerebral     Social History   Socioeconomic History   Marital status: Married    Spouse name: Not on file   Number of children: Not on file   Years of education: Not on file   Highest education level: Not on file  Occupational History    Not on file  Tobacco Use   Smoking status: Never   Smokeless tobacco: Never  Substance and Sexual Activity   Alcohol use: No   Drug use: Yes    Types: Marijuana    Comment: Marijuana every other day    Sexual activity: Yes  Other Topics Concern   Not on file  Social History Narrative   Still active, gets around without cane or walker. Lives at home with wife, no children. Unemployed. Never smoker, no alcohol.    Social Determinants of Health   Financial Resource Strain: Not on file  Food Insecurity: Not on file  Transportation Needs: Not on file  Physical Activity: Not on file  Stress: Not on file  Social Connections: Not on file  Intimate Partner Violence: Not on file    Review of Systems  All other systems reviewed and are negative.       Objective    BP 116/60   Pulse 75   Temp 97.9 F (36.6 C)   Resp 18   Ht 6' (1.829 m)   Wt 171 lb 8 oz (77.8 kg)   SpO2 99%   BMI 23.26 kg/m   Physical Exam Constitutional:      Appearance: Normal appearance.  HENT:     Head: Normocephalic and atraumatic.     Mouth/Throat:     Mouth: Mucous membranes are moist.     Comments: Mild PND present Eyes:     Extraocular Movements: Extraocular movements intact.     Conjunctiva/sclera: Conjunctivae normal.     Pupils: Pupils are equal, round, and reactive to light.  Cardiovascular:     Rate and Rhythm: Normal rate and regular rhythm.  Pulmonary:     Effort: Pulmonary effort is normal.     Breath sounds: Normal breath sounds.  Musculoskeletal:        General: Tenderness present.     Right lower leg: No edema.     Left lower leg: No edema.     Comments: Right trigger finger present, moderate  Skin:    General: Skin is warm and dry.  Neurological:     General: No focal deficit present.     Mental Status: He is alert. Mental status is at baseline.  Psychiatric:        Mood and Affect: Mood normal.        Behavior: Behavior normal.         Assessment & Plan:    1. Type 1 diabetes mellitus with hyperglycemia (HCC)/Lipid screening: Follows with Endocrinology at [facility], note and labs from 07/22/22 reviewed. Will check lipid panel for this year.   - CBC w/Diff/Platelet - Lipid Profile  2. Elevated carbon dioxide level: Smokes occasionally, wife smokes. Does have a family history of sickle cell anemia in his father. Will obtain CBC to assess hemoglobin today.   - CBC w/Diff/Platelet  3. Trigger middle finger of right hand: Mild to moderate in severity but does cause  him some pain. Conservative management discussed, also discussed potential for trigger finger release. Information printed for the patient.   4. Other insomnia: Discussed sleep hygiene tactics. He works second shift which is the primary cause of his insomnia. Recommend starting with over the counter melatonin and rapid eye movement before bed. Follow up if symptoms progress.    Return in about 1 year (around 07/28/2023).   Teodora Medici, DO

## 2022-07-28 NOTE — Patient Instructions (Addendum)
It was great seeing you today!  Plan discussed at today's visit: -Blood work ordered today, results will be uploaded to [facility].  -For sleep issues, I recommend starting with over the counter melatonin 3 mg but can increase dosage as needed -For trigger finger (more info below) consider seeing [facility] to discuss   Follow up in: 1 year or sooner as needed  Take care and let us know if you have any questions or concerns prior to your next visit.  Dr. Rosana Berger  Insomnia Insomnia is a sleep disorder that makes it difficult to fall asleep or stay asleep. Insomnia can cause fatigue, low energy, difficulty concentrating, mood swings, and poor performance at work or school. There are three different ways to classify insomnia: Difficulty falling asleep. Difficulty staying asleep. Waking up too early in the morning. Any type of insomnia can be long-term (chronic) or short-term (acute). Both are common. Short-term insomnia usually lasts for 3 months or less. Chronic insomnia occurs at least three times a week for longer than 3 months. What are the causes? Insomnia may be caused by another condition, situation, or substance, such as: Having certain mental health conditions, such as anxiety and depression. Using caffeine, alcohol, tobacco, or drugs. Having gastrointestinal conditions, such as gastroesophageal reflux disease (GERD). Having certain medical conditions. These include: Asthma. Alzheimer's disease. Stroke. Chronic pain. An overactive thyroid gland (hyperthyroidism). Other sleep disorders, such as restless legs syndrome and sleep apnea. Menopause. Sometimes, the cause of insomnia may not be known. What increases the risk? Risk factors for insomnia include: Gender. Females are affected more often than males. Age. Insomnia is more common as people get older. Stress and certain medical and mental health conditions. Lack of exercise. Having an irregular work schedule. This may  include working night shifts and traveling between different time zones. What are the signs or symptoms? If you have insomnia, the main symptom is having trouble falling asleep or having trouble staying asleep. This may lead to other symptoms, such as: Feeling tired or having low energy. Feeling nervous about going to sleep. Not feeling rested in the morning. Having trouble concentrating. Feeling irritable, anxious, or depressed. How is this diagnosed? This condition may be diagnosed based on: Your symptoms and medical history. Your health care provider may ask about: Your sleep habits. Any medical conditions you have. Your mental health. A physical exam. How is this treated? Treatment for insomnia depends on the cause. Treatment may focus on treating an underlying condition that is causing the insomnia. Treatment may also include: Medicines to help you sleep. Counseling or therapy. Lifestyle adjustments to help you sleep better. Follow these instructions at home: Eating and drinking  Limit or avoid alcohol, caffeinated beverages, and products that contain nicotine and tobacco, especially close to bedtime. These can disrupt your sleep. Do not eat a large meal or eat spicy foods right before bedtime. This can lead to digestive discomfort that can make it hard for you to sleep. Sleep habits  Keep a sleep diary to help you and your health care provider figure out what could be causing your insomnia. Write down: When you sleep. When you wake up during the night. How well you sleep and how rested you feel the next day. Any side effects of medicines you are taking. What you eat and drink. Make your bedroom a dark, comfortable place where it is easy to fall asleep. Put up shades or blackout curtains to block light from outside. Use a white noise machine to  block noise. Keep the temperature cool. Limit screen use before bedtime. This includes: Not watching TV. Not using your  smartphone, tablet, or computer. Stick to a routine that includes going to bed and waking up at the same times every day and night. This can help you fall asleep faster. Consider making a quiet activity, such as reading, part of your nighttime routine. Try to avoid taking naps during the day so that you sleep better at night. Get out of bed if you are still awake after 15 minutes of trying to sleep. Keep the lights down, but try reading or doing a quiet activity. When you feel sleepy, go back to bed. General instructions Take over-the-counter and prescription medicines only as told by your health care provider. Exercise regularly as told by your health care provider. However, avoid exercising in the hours right before bedtime. Use relaxation techniques to manage stress. Ask your health care provider to suggest some techniques that may work well for you. These may include: Breathing exercises. Routines to release muscle tension. Visualizing peaceful scenes. Make sure that you drive carefully. Do not drive if you feel very sleepy. Keep all follow-up visits. This is important. Contact a health care provider if: You are tired throughout the day. You have trouble in your daily routine due to sleepiness. You continue to have sleep problems, or your sleep problems get worse. Get help right away if: You have thoughts about hurting yourself or someone else. Get help right away if you feel like you may hurt yourself or others, or have thoughts about taking your own life. Go to your nearest emergency room or: Call 911. Call the [facility] at (702) 694-1532 or 988. This is open 24 hours a day. Text the Crisis Text Line at 9800850781. Summary Insomnia is a sleep disorder that makes it difficult to fall asleep or stay asleep. Insomnia can be long-term (chronic) or short-term (acute). Treatment for insomnia depends on the cause. Treatment may focus on treating an underlying condition  that is causing the insomnia. Keep a sleep diary to help you and your health care provider figure out what could be causing your insomnia. This information is not intended to replace advice given to you by your health care provider. Make sure you discuss any questions you have with your health care provider. Document Revised: 04/15/2021 Document Reviewed: 04/15/2021 Elsevier Patient Education  [place].  Trigger Finger  Trigger finger, also called stenosing tenosynovitis, is a condition that causes a finger or thumb to get stuck in a bent position. Each finger has tough, cord-like tissue (tendon) that connects muscle to bone, and each tendon passes through a tunnel of tissue (tendon sheath). The tendon sheaths are held close to the bone by a pulley. There is a pulley called the A1 pulley that is involved in the triggering of a finger or thumb. To move your finger, your tendon needs to glide freely through the sheath. Trigger finger happens when the tendon or the sheath thickens, making it difficult to bend or straighten your finger as the thickened tendon gets stuck in the A1 pulley. Trigger finger can affect any of the fingers or the thumbs. Mild cases may clear up with rest and medicine. Severe cases require more treatment. What are the causes? Trigger finger or thumb is caused by a thickened finger tendon or tendon sheath. The cause of this thickening is not known. What increases the risk? The following factors may make you more likely to develop  this condition: Doing the same movements many times (repetitive activity) that require a strong grip. Having certain health conditions. These include rheumatoid arthritis, gout, carpal tunnel syndrome, or diabetes. Being 93-42 years old. Being male. What are the signs or symptoms? Symptoms of this condition include: Pain when bending or straightening your finger. Tenderness, swelling, or a lump in the palm of your hand just below the  finger joint. Hearing a noise like a pop or a snap when you try to straighten your finger. Feeling a catch or locked feeling when you try to straighten your finger. Being unable to straighten your finger without help from your other hand. How is this diagnosed? This condition is diagnosed based on your symptoms and a physical exam. How is this treated? This condition may be treated by: Resting your finger and avoiding activities that make symptoms worse. Wearing a finger splint to keep your finger extended. Taking NSAIDs, such as ibuprofen, to relieve pain and swelling around the tendon. Doing gentle exercises to stretch the finger as told by your health care provider. Having medicine that reduces swelling and inflammation (steroids) injected into the tendon sheath. Injections may need to be repeated. Trigger finger release. This surgery is done to open the pulley. This may be done if other treatments do not work and you cannot straighten or bend your finger. You may need hand therapy after surgery. Follow these instructions at home: If you have a removable splint: Wear the splint as told by your provider. Remove it only as told by your provider. Check the skin around the splint every day. Tell your provider about any concerns. Loosen the splint if your fingers tingle, become numb, or turn cold and blue. Keep the splint clean and dry. If the splint is not waterproof: Do not let it get wet. Cover it with a watertight covering when you take a bath or shower. Managing pain, stiffness, and swelling     If told, put ice on the painful area. If you have a removable splint, remove it as told by your health care provider. Put ice in a plastic bag. Place a towel between your skin and the bag or between your splint and the bag. Leave the ice on for 20 minutes, 2-3 times a day. If told, apply heat to the affected area as often as told by your provider. Use the heat source that your provider  recommends, such as a moist heat pack or a heating pad. Place a towel between your skin and the heat source. Leave the heat on for 20-30 minutes. If your skin turns bright red, remove the ice or heat right away to prevent skin damage. The risk of damage is higher if you cannot feel pain, heat, or cold. Activity Rest your finger as told by your provider. Avoid activities that make the pain worse. Return to your normal activities as told by your provider. Ask your provider what activities are safe for you. Do exercises as told by your provider. Ask your provider when it is safe to drive if you have a splint on your hand. General instructions Take over-the-counter and prescription medicines only as told by your provider. Keep all follow-up visits. These are needed to see how you are progressing. Contact a health care provider if: Your symptoms are not improving with home care. This information is not intended to replace advice given to you by your health care provider. Make sure you discuss any questions you have with your health care provider. Document  Revised: 12/20/2021 Document Reviewed: 12/20/2021 Elsevier Patient Education  [place].  Trigger Finger Release  Trigger finger release is a procedure to treat trigger finger (stenosingtenosynovitis). Trigger finger is a condition that causes a finger to get stuck in a bent position. Each finger has a tendon, and each tendon passes through a tendon sheath. The tendon sheaths are held close to the bone by a pulley. There is a pulley called the A1 pulley that is involved in triggering a finger or thumb. Trigger finger happens when the tendon or the sheath thickens. This makes it difficult to bend or straighten your finger as the thickened tendon gets stuck in the A1 pulley. This makes it hard to bend and straighten your finger. During this procedure, the tendon sheath is cut to release movement of the tendon. You may need this surgery if  other treatments have not worked. The goal of the procedure is to allow you to freely bend and straighten your finger. Tell your health care provider about: Any allergies you have. All medicines you are taking, including vitamins, herbs, eye drops, creams, and over-the-counter medicines. Any problems you or family members have had with anesthesia. Any bleeding problems you have. Any surgeries you have had. Any medical conditions you have. Whether you are pregnant or may be pregnant. What are the risks? Your provider will talk with you about risks. These may include: Finger stiffness. Not being able to completely straighten the finger. Injury to a nerve, blood vessel, or tendon in the finger. Infection. Bleeding. What happens before the procedure? Medicines Ask your provider about: Changing or stopping your regular medicines. These include any diabetes medicines or blood thinners you take. Taking medicines such as aspirin and ibuprofen. These medicines can thin your blood. Do not take them unless your provider tells you to. Taking over-the-counter medicines, vitamins, herbs, and supplements. General instructions Follow instructions from your provider about what you may eat and drink. Do not use any products that contain nicotine or tobacco before the procedure. These products include cigarettes, chewing tobacco, and vaping devices, such as e-cigarettes. If you need help quitting, ask your provider. Ask your provider: How your surgery site will be marked. What steps will be taken to help prevent infection. These steps may include: Washing skin with a soap that kills germs. Receiving antibiotics. Plan to have a responsible adult: Take you home from the hospital or clinic. You will not be allowed to drive. Care for you for the time you are told. What happens during the procedure? You may be given a sedative. This helps you relax. You will be given an injection of local anesthesia in  your palm and finger. This will numb the area. You may have a strip of cloth or elastic band (tourniquet) placed above your hand to reduce blood flow to your hand. A small incision will be made in the palm of your hand near the base of your finger. In some cases, a needle-like surgical cutting instrument will be inserted through the incision to cut open the tendon sheath. Sometimes, a slightly larger incision is necessary so your surgeon can see the tendon sheath through the incision. In this case, a scalpel or scissors will be used to open the tendon sheath. This is always the procedure if the affected finger is your thumb. You will be asked to bend and straighten your finger to make sure the tendon is free and moves the finger smoothly. The tourniquet will be removed. You may need a  few stitches (sutures) to close the incision. A bandage (dressing) will be applied to your palm. A compression dressing will be wrapped around your hand. The procedure may vary among providers and hospitals. What happens after the procedure? You will be encouraged to start moving your finger right after surgery. Your blood pressure, heart rate, breathing rate, and blood oxygen level may be monitored until you leave the hospital or clinic. You can go home right after the procedure. Your provider will give you instructions for taking care of yourself at home after the procedure. This information is not intended to replace advice given to you by your health care provider. Make sure you discuss any questions you have with your health care provider. Document Revised: 12/17/2021 Document Reviewed: 12/17/2021 Elsevier Patient Education  [place].

## 2022-09-12 DIAGNOSIS — J029 Acute pharyngitis, unspecified: Secondary | ICD-10-CM | POA: Diagnosis not present

## 2022-09-12 DIAGNOSIS — R051 Acute cough: Secondary | ICD-10-CM | POA: Diagnosis not present

## 2022-09-12 DIAGNOSIS — H1033 Unspecified acute conjunctivitis, bilateral: Secondary | ICD-10-CM | POA: Diagnosis not present

## 2022-09-22 DIAGNOSIS — B309 Viral conjunctivitis, unspecified: Secondary | ICD-10-CM | POA: Diagnosis not present

## 2022-10-06 ENCOUNTER — Inpatient Hospital Stay: Payer: BC Managed Care – PPO

## 2022-10-06 ENCOUNTER — Inpatient Hospital Stay
Admission: EM | Admit: 2022-10-06 | Discharge: 2022-10-07 | DRG: 639 | Disposition: A | Discharge status: Home / Self Care | Payer: BC Managed Care – PPO | Attending: Internal Medicine | Admitting: Internal Medicine

## 2022-10-06 ENCOUNTER — Other Ambulatory Visit: Payer: Self-pay

## 2022-10-06 DIAGNOSIS — Z888 Allergy status to other drugs, medicaments and biological substances status: Secondary | ICD-10-CM

## 2022-10-06 DIAGNOSIS — D72829 Elevated white blood cell count, unspecified: Secondary | ICD-10-CM | POA: Diagnosis present

## 2022-10-06 DIAGNOSIS — Z79899 Other long term (current) drug therapy: Secondary | ICD-10-CM

## 2022-10-06 DIAGNOSIS — E1110 Type 2 diabetes mellitus with ketoacidosis without coma: Secondary | ICD-10-CM | POA: Diagnosis not present

## 2022-10-06 DIAGNOSIS — D1803 Hemangioma of intra-abdominal structures: Secondary | ICD-10-CM | POA: Diagnosis not present

## 2022-10-06 DIAGNOSIS — E860 Dehydration: Secondary | ICD-10-CM | POA: Diagnosis not present

## 2022-10-06 DIAGNOSIS — Z9641 Presence of insulin pump (external) (internal): Secondary | ICD-10-CM | POA: Diagnosis not present

## 2022-10-06 DIAGNOSIS — Z9049 Acquired absence of other specified parts of digestive tract: Secondary | ICD-10-CM | POA: Diagnosis not present

## 2022-10-06 DIAGNOSIS — R945 Abnormal results of liver function studies: Secondary | ICD-10-CM | POA: Diagnosis not present

## 2022-10-06 DIAGNOSIS — E1010 Type 1 diabetes mellitus with ketoacidosis without coma: Principal | ICD-10-CM | POA: Diagnosis present

## 2022-10-06 DIAGNOSIS — K769 Liver disease, unspecified: Secondary | ICD-10-CM | POA: Diagnosis not present

## 2022-10-06 LAB — BLOOD GAS, VENOUS
Acid-base deficit: 7.2 mmol/L — ABNORMAL HIGH (ref 0.0–2.0)
Bicarbonate: 20.5 mmol/L (ref 20.0–28.0)
O2 Saturation: 79.2 %
Patient temperature: 37
pCO2, Ven: 49 mmHg (ref 44–60)
pH, Ven: 7.23 — ABNORMAL LOW (ref 7.25–7.43)
pO2, Ven: 50 mmHg — ABNORMAL HIGH (ref 32–45)

## 2022-10-06 LAB — URINALYSIS, ROUTINE W REFLEX MICROSCOPIC
Bacteria, UA: NONE SEEN
Bilirubin Urine: NEGATIVE
Glucose, UA: >=500 mg/dL — AB
Hgb urine dipstick: NEGATIVE
Ketones, ur: 20 mg/dL — AB
Leukocytes,Ua: NEGATIVE
Nitrite: NEGATIVE
Protein, ur: NEGATIVE mg/dL
Specific Gravity, Urine: 1.023 (ref 1.005–1.030)
pH: 5 (ref 5.0–8.0)

## 2022-10-06 LAB — BASIC METABOLIC PANEL
Anion gap: 11 (ref 5–15)
BUN: 26 mg/dL — ABNORMAL HIGH (ref 6–20)
CO2: 23 mmol/L (ref 22–32)
Calcium: 9.1 mg/dL (ref 8.9–10.3)
Chloride: 106 mmol/L (ref 98–111)
Creatinine, Ser: 1.06 mg/dL (ref 0.61–1.24)
GFR, Estimated: >60 mL/min (ref >60)
Glucose, Bld: 257 mg/dL — ABNORMAL HIGH (ref 70–99)
Potassium: 3.7 mmol/L (ref 3.5–5.1)
Sodium: 140 mmol/L (ref 135–145)

## 2022-10-06 LAB — CBC WITH DIFFERENTIAL/PLATELET
Abs Immature Granulocytes: 0.14 10*3/uL — ABNORMAL HIGH (ref 0.00–0.07)
Basophils Absolute: 0.1 10*3/uL (ref 0.0–0.1)
Basophils Relative: 0 %
Eosinophils Absolute: 0 10*3/uL (ref 0.0–0.5)
Eosinophils Relative: 0 %
HCT: 36.1 % — ABNORMAL LOW (ref 39.0–52.0)
Hemoglobin: 12 g/dL — ABNORMAL LOW (ref 13.0–17.0)
Immature Granulocytes: 1 %
Lymphocytes Relative: 6 %
Lymphs Abs: 1.4 10*3/uL (ref 0.7–4.0)
MCH: 27.6 pg (ref 26.0–34.0)
MCHC: 33.2 g/dL (ref 30.0–36.0)
MCV: 83 fL (ref 80.0–100.0)
Monocytes Absolute: 0.9 10*3/uL (ref 0.1–1.0)
Monocytes Relative: 4 %
Neutro Abs: 21.1 10*3/uL — ABNORMAL HIGH (ref 1.7–7.7)
Neutrophils Relative %: 89 %
Platelets: 233 10*3/uL (ref 150–400)
RBC: 4.35 MIL/uL (ref 4.22–5.81)
RDW: 12.7 % (ref 11.5–15.5)
WBC: 23.6 10*3/uL — ABNORMAL HIGH (ref 4.0–10.5)
nRBC: 0 % (ref 0.0–0.2)

## 2022-10-06 LAB — COMPREHENSIVE METABOLIC PANEL
ALT: 52 U/L — ABNORMAL HIGH (ref 0–44)
AST: 48 U/L — ABNORMAL HIGH (ref 15–41)
Albumin: 5.2 g/dL — ABNORMAL HIGH (ref 3.5–5.0)
Alkaline Phosphatase: 188 U/L — ABNORMAL HIGH (ref 38–126)
Anion gap: 19 — ABNORMAL HIGH (ref 5–15)
BUN: 28 mg/dL — ABNORMAL HIGH (ref 6–20)
CO2: 20 mmol/L — ABNORMAL LOW (ref 22–32)
Calcium: 9.5 mg/dL (ref 8.9–10.3)
Chloride: 97 mmol/L — ABNORMAL LOW (ref 98–111)
Creatinine, Ser: 1.25 mg/dL — ABNORMAL HIGH (ref 0.61–1.24)
GFR, Estimated: >60 mL/min (ref >60)
Glucose, Bld: 542 mg/dL (ref 70–99)
Potassium: 4 mmol/L (ref 3.5–5.1)
Sodium: 136 mmol/L (ref 135–145)
Total Bilirubin: 1.6 mg/dL — ABNORMAL HIGH (ref 0.3–1.2)
Total Protein: 7.9 g/dL (ref 6.5–8.1)

## 2022-10-06 LAB — CBC
HCT: 41.6 % (ref 39.0–52.0)
Hemoglobin: 13.7 g/dL (ref 13.0–17.0)
MCH: 27.4 pg (ref 26.0–34.0)
MCHC: 32.9 g/dL (ref 30.0–36.0)
MCV: 83.2 fL (ref 80.0–100.0)
Platelets: 261 10*3/uL (ref 150–400)
RBC: 5 MIL/uL (ref 4.22–5.81)
RDW: 12.8 % (ref 11.5–15.5)
WBC: 21.6 10*3/uL — ABNORMAL HIGH (ref 4.0–10.5)
nRBC: 0 % (ref 0.0–0.2)

## 2022-10-06 LAB — LACTIC ACID, PLASMA
Lactic Acid, Venous: 4.3 mmol/L (ref 0.5–1.9)
Lactic Acid, Venous: 2.3 mmol/L (ref 0.5–1.9)

## 2022-10-06 LAB — CBG MONITORING, ED
Glucose-Capillary: 479 mg/dL — ABNORMAL HIGH (ref 70–99)
Glucose-Capillary: 526 mg/dL (ref 70–99)
Glucose-Capillary: 395 mg/dL — ABNORMAL HIGH (ref 70–99)
Glucose-Capillary: 236 mg/dL — ABNORMAL HIGH (ref 70–99)
Glucose-Capillary: 239 mg/dL — ABNORMAL HIGH (ref 70–99)
Glucose-Capillary: 252 mg/dL — ABNORMAL HIGH (ref 70–99)
Glucose-Capillary: 219 mg/dL — ABNORMAL HIGH (ref 70–99)

## 2022-10-06 LAB — LIPASE, BLOOD: Lipase: 20 U/L (ref 11–51)

## 2022-10-06 LAB — BETA-HYDROXYBUTYRIC ACID
Beta-Hydroxybutyric Acid: 3.02 mmol/L — ABNORMAL HIGH (ref 0.05–0.27)
Beta-Hydroxybutyric Acid: 2.43 mmol/L — ABNORMAL HIGH (ref 0.05–0.27)

## 2022-10-06 LAB — GROUP A STREP BY PCR: Group A Strep by PCR: NOT DETECTED

## 2022-10-06 LAB — TROPONIN I (HIGH SENSITIVITY): Troponin I (High Sensitivity): 5 ng/L (ref <18)

## 2022-10-06 MED ORDER — POTASSIUM CHLORIDE 10 MEQ/100ML IV SOLN
10.0000 meq | INTRAVENOUS | Status: AC
  Administered 2022-10-06 (×2): 10 meq via INTRAVENOUS
  Filled 2022-10-06 (×2): qty 100

## 2022-10-06 MED ORDER — LACTATED RINGERS IV BOLUS
20.0000 mL/kg | Freq: Once | INTRAVENOUS | Status: AC
  Administered 2022-10-06: 1360 mL via INTRAVENOUS

## 2022-10-06 MED ORDER — INSULIN REGULAR(HUMAN) IN NACL 100-0.9 UT/100ML-% IV SOLN
INTRAVENOUS | Status: DC
  Administered 2022-10-06: 8 [IU]/h via INTRAVENOUS
  Administered 2022-10-07: 3.2 [IU]/h via INTRAVENOUS
  Filled 2022-10-06: qty 100

## 2022-10-06 MED ORDER — ONDANSETRON HCL 4 MG/2ML IJ SOLN
4.0000 mg | Freq: Once | INTRAMUSCULAR | Status: AC
  Administered 2022-10-06: 4 mg via INTRAVENOUS
  Filled 2022-10-06: qty 2

## 2022-10-06 MED ORDER — INSULIN DETEMIR 100 UNIT/ML ~~LOC~~ SOLN
7.0000 [IU] | Freq: Three times a day (TID) | SUBCUTANEOUS | Status: DC
  Administered 2022-10-06: 7 [IU] via SUBCUTANEOUS
  Filled 2022-10-06 (×2): qty 0.07

## 2022-10-06 MED ORDER — DEXTROSE IN LACTATED RINGERS 5 % IV SOLN: INTRAVENOUS | Status: DC

## 2022-10-06 MED ORDER — DEXTROSE 50 % IV SOLN: 0.0000 mL | INTRAVENOUS | Status: DC | PRN

## 2022-10-06 MED ORDER — LACTATED RINGERS IV SOLN: INTRAVENOUS | Status: DC

## 2022-10-06 NOTE — Assessment & Plan Note (Addendum)
Agree with IV fluid boluses given in the ER and initiation of insulin infusion with basic metabolic panel monitoring every 4 hours with plan to transition from insulin infusion to subcutaneous insulin once anion gap is closed.  At that time patient's diet will be restarted.  Since the plan is to ultimately get the patient back on the insulin pump, I will start the patient on NPH 7 units every 8 hourly as basal replacement.  With regards to precipitating factors, my principal concern is that the patient has had a malfunction of his insulin pump.  However patient does have a marked leukocytosis and some elevation in LFTs.  Therefore I am checking a liver ultrasound chest x-ray and acute hepatitis panel.  Patient did report some rigors however these have resolved on their own.  No clinical focus of infection. Therefore I do not see a reason to start antibiotics at this time.  I will check the lactic acid level again which I believe is elevated due to dehydration.  As is the creatinine and BUN.  We will trend these.  Patient is looking nontoxic at this time.  Maintain on telemetry tonight

## 2022-10-06 NOTE — ED Notes (Signed)
Critical CBG of 542. Dr. Derrill Kay notified.

## 2022-10-06 NOTE — ED Provider Notes (Signed)
[facility] Provider Note    Event Date/Time   First MD Initiated Contact with Patient 10/06/22 1525     (approximate)   History   Emesis and Hyperglycemia   HPI  Richard Bray is a 42 y.o. male with a history of type 1 diabetes who presents with nausea and vomiting since last night associated with generalized weakness as well as erratic blood glucose readings.  The patient also has some sore throat.  He denies cough or fever.  He has no diarrhea or abdominal pain.  The patient states that he is on an insulin pump although it is about to expire.  I reviewed the past medical records.  The patient was most recently admitted for DKA in 2016.  His most recent outpatient encounter was with [facility] for an initial visit.  He uses an insulin pump.   Physical Exam   Triage Vital Signs: ED Triage Vitals  Enc Vitals Group     BP 10/06/22 1137 106/67     Pulse Rate 10/06/22 1137 92     Resp 10/06/22 1137 17     Temp 10/06/22 1137 97.9 F (36.6 C)     Temp Source 10/06/22 1137 Oral     SpO2 10/06/22 1137 100 %     Weight 10/06/22 1138 150 lb (68 kg)     Height --      Head Circumference --      Peak Flow --      Pain Score 10/06/22 1138 0     Pain Loc --      Pain Edu? --      Excl. in GC? --     Most recent vital signs: Vitals:   10/06/22 1835 10/06/22 2200  BP: 128/70 124/77  Pulse: (!) 105 (!) 101  Resp: 16 17  Temp: 98.4 F (36.9 C)   SpO2: 98% 99%     General: Awake, no distress.  CV:  Good peripheral perfusion.  Resp:  Normal effort.  Abd:  No distention.  Other:  Dry mucous membranes.  Oropharynx clear with no erythema or exudate.   ED Results / Procedures / Treatments   Labs (all labs ordered are listed, but only abnormal results are displayed) Labs Reviewed  COMPREHENSIVE METABOLIC PANEL - Abnormal; Notable for the following components:      Result Value   Chloride 97 (*)    CO2 20 (*)    Glucose, Bld  542 (*)    BUN 28 (*)    Creatinine, Ser 1.25 (*)    Albumin 5.2 (*)    AST 48 (*)    ALT 52 (*)    Alkaline Phosphatase 188 (*)    Total Bilirubin 1.6 (*)    Anion gap 19 (*)    All other components within normal limits  CBC - Abnormal; Notable for the following components:   WBC 21.6 (*)    All other components within normal limits  URINALYSIS, ROUTINE W REFLEX MICROSCOPIC - Abnormal; Notable for the following components:   Color, Urine STRAW (*)    APPearance CLEAR (*)    Glucose, UA >=500 (*)    Ketones, ur 20 (*)    All other components within normal limits  BETA-HYDROXYBUTYRIC ACID - Abnormal; Notable for the following components:   Beta-Hydroxybutyric Acid 3.02 (*)    All other components within normal limits  BETA-HYDROXYBUTYRIC ACID - Abnormal; Notable for the following components:   Beta-Hydroxybutyric Acid 2.43 (*)  All other components within normal limits  BLOOD GAS, VENOUS - Abnormal; Notable for the following components:   pH, Ven 7.23 (*)    pO2, Ven 50 (*)    Acid-base deficit 7.2 (*)    All other components within normal limits  LACTIC ACID, PLASMA - Abnormal; Notable for the following components:   Lactic Acid, Venous 4.3 (*)    All other components within normal limits  BASIC METABOLIC PANEL - Abnormal; Notable for the following components:   Glucose, Bld 257 (*)    BUN 26 (*)    All other components within normal limits  LACTIC ACID, PLASMA - Abnormal; Notable for the following components:   Lactic Acid, Venous 2.3 (*)    All other components within normal limits  CBC WITH DIFFERENTIAL/PLATELET - Abnormal; Notable for the following components:   WBC 23.6 (*)    Hemoglobin 12.0 (*)    HCT 36.1 (*)    Neutro Abs 21.1 (*)    Abs Immature Granulocytes 0.14 (*)    All other components within normal limits  CBG MONITORING, ED - Abnormal; Notable for the following components:   Glucose-Capillary 479 (*)    All other components within normal limits   CBG MONITORING, ED - Abnormal; Notable for the following components:   Glucose-Capillary 526 (*)    All other components within normal limits  CBG MONITORING, ED - Abnormal; Notable for the following components:   Glucose-Capillary 395 (*)    All other components within normal limits  CBG MONITORING, ED - Abnormal; Notable for the following components:   Glucose-Capillary 236 (*)    All other components within normal limits  CBG MONITORING, ED - Abnormal; Notable for the following components:   Glucose-Capillary 239 (*)    All other components within normal limits  CBG MONITORING, ED - Abnormal; Notable for the following components:   Glucose-Capillary 252 (*)    All other components within normal limits  CBG MONITORING, ED - Abnormal; Notable for the following components:   Glucose-Capillary 219 (*)    All other components within normal limits  GROUP A STREP BY PCR  CULTURE, BLOOD (ROUTINE X 2)  CULTURE, BLOOD (ROUTINE X 2)  LIPASE, BLOOD  BASIC METABOLIC PANEL  BASIC METABOLIC PANEL  HEPATIC FUNCTION PANEL  BETA-HYDROXYBUTYRIC ACID  LACTIC ACID, PLASMA  BASIC METABOLIC PANEL  HEPATITIS PANEL, ACUTE  BASIC METABOLIC PANEL  TROPONIN I (HIGH SENSITIVITY)     EKG     RADIOLOGY    PROCEDURES:  Critical Care performed: Yes, see critical care procedure note(s)  .Critical Care  Performed by: Dionne Bucy, MD Authorized by: Dionne Bucy, MD   Critical care provider statement:    Critical care time (minutes):  30   Critical care time was exclusive of:  Separately billable procedures and treating other patients   Critical care was necessary to treat or prevent imminent or life-threatening deterioration of the following conditions:  Endocrine crisis   Critical care was time spent personally by me on the following activities:  Development of treatment plan with patient or surrogate, discussions with consultants, evaluation of patient's response to  treatment, examination of patient, ordering and review of laboratory studies, ordering and review of radiographic studies, ordering and performing treatments and interventions, pulse oximetry, re-evaluation of patient's condition, review of old charts and obtaining history from patient or surrogate   Care discussed with: admitting provider      MEDICATIONS ORDERED IN ED: Medications  insulin regular, human (MYXREDLIN)  100 units/ 100 mL infusion (2.6 Units/hr Intravenous Rate/Dose Change 10/06/22 2310)  lactated ringers infusion ( Intravenous New Bag/Given 10/06/22 1651)  dextrose 5 % in lactated ringers infusion ( Intravenous New Bag/Given 10/06/22 1847)  dextrose 50 % solution 0-50 mL (has no administration in time range)  insulin detemir (LEVEMIR) injection 7 Units (7 Units Subcutaneous Given 10/06/22 2309)  lactated ringers bolus 1,360 mL (0 mLs Intravenous Stopped 10/06/22 1803)  potassium chloride 10 mEq in 100 mL IVPB (0 mEq Intravenous Stopped 10/06/22 1840)  ondansetron (ZOFRAN) injection 4 mg (4 mg Intravenous Given 10/06/22 1636)     IMPRESSION / MDM / ASSESSMENT AND PLAN / ED COURSE  I reviewed the triage vital signs and the nursing notes.  42 year old male with PMH as noted above presents with hyperglycemia, nausea, and vomiting along with generalized weakness.  Initial blood glucose reading is over 500.  Differential diagnosis includes, but is not limited to, DKA, uncomplicated hyperglycemia, less likely HHS.  CMP shows elevated anion gap and urinalysis shows ketones, suggesting DKA.  WBC count is elevated although the patient does not have symptoms of acute infection other than a mild sore throat.  We will start fluids, an insulin infusion, additional workup for DKA, and plan for hospital admission.  Patient's presentation is most consistent with acute presentation with potential threat to life or bodily function.  The patient is on the cardiac monitor to evaluate for evidence of  arrhythmia and/or significant heart rate changes.  ----------------------------------------- 6:23 PM on 10/06/2022 -----------------------------------------  Glucose is improving.  Vital signs remain stable.  I consulted Dr. Maryjean Ka from the hospitalist service; based on our discussion he agrees to evaluate the patient for admission.     FINAL CLINICAL IMPRESSION(S) / ED DIAGNOSES   Final diagnoses:  Diabetic ketoacidosis without coma associated with type 1 diabetes mellitus (HCC)     Rx / DC Orders   ED Discharge Orders     None        Note:  This document was prepared using Dragon voice recognition software and may include unintentional dictation errors.    Dionne Bucy, MD 10/06/22 [phone]

## 2022-10-06 NOTE — ED Triage Notes (Addendum)
Pt is a type one diabetic and pt has been V/D/N with dizziness since last night. Pt family sts that pt has a insulin pump but it just keeps on saying HIGH and it wont come down. Pt sts that his last A1c was 9.0

## 2022-10-06 NOTE — H&P (Signed)
History and Physical    Patient: Richard Bray ZOX:[id number] DOB: February 17, 1981 DOA: 10/06/2022 DOS: the patient was seen and examined on 10/06/2022 PCP: Margarita Mail, DO  Patient coming from: Home  Chief Complaint:  Chief Complaint  Patient presents with   Emesis   Hyperglycemia   HPI: Richard Bray is a 42 y.o. male with medical history significant of type 1 diabetes mellitus for which patient uses insulin pump and continuous blood glucose monitor.  Patient reports that for the past 1 week there have been several instances when his blood glucose monitor has been unable to get glucose reading.  However it would then get a reading within the next 3 hours and per protocol patient has continues to use the insulin pump.  Starting last night at approximately 11 PM, patient had blood glucose reading of "high ".  And patient did give himself supplemental insulin from his pump accordingly.  Unfortunately till approximately 10 AM, when patient presented to the ER here, patient has continuously had high readings.  Patient also reports having polydipsia polyuria starting last evening at approximately 2 AM associated with about 6 episodes of nonbloody emesis.  There was slight abdominal discomfort crampy in nature.  No diarrhea.  Patient reports a shivering episode earlier this morning at home.  However no fevers are reported.  Patient's ER course is notable for finding of DKA has been started on IV fluids and IV infusion of insulin.  Patient is now feeling much better is not feeling cold not shivering and not having any abdominal discomfort.  Medical evaluation is sought.  Review of Systems: As mentioned in the history of present illness. All other systems reviewed and are negative. Past Medical History:  Diagnosis Date   Diabetes mellitus    Type 1 diabetes mellitus (HCC)    Diagnosed at 42 yo with DKA / ICU admission   Past Surgical History:  Procedure Laterality Date   CHOLECYSTECTOMY N/A  11/03/2014   Procedure: Laparoscopic cholecystectomy ;  Surgeon: Lattie Haw, MD;  Location: [facility] ORS;  Service: General;  Laterality: N/A;   ESOPHAGOGASTRODUODENOSCOPY Left 11/02/2014   Procedure: ESOPHAGOGASTRODUODENOSCOPY (EGD);  Surgeon: Wallace Cullens, MD;  Location: [facility] ENDOSCOPY;  Service: Endoscopy;  Laterality: Left;   Social History:  reports that he has never smoked. He has never used smokeless tobacco. He reports current drug use. Drug: Marijuana. He reports that he does not drink alcohol.  Allergies  Allergen Reactions   Metformin Other (See Comments)    GI Upset    Family History  Problem Relation Age of Onset   Sickle cell anemia Father    Aneurysm Mother        Cerebral     Prior to Admission medications   Medication Sig Start Date End Date Taking? Authorizing Provider  Continuous Blood Gluc Sensor (DEXCOM G6 SENSOR) MISC SMARTSIG:Topical Every 10 Days 07/10/22   [provider]  Continuous Blood Gluc Transmit (DEXCOM G6 TRANSMITTER) MISC USE TO MONITOR BLOOD SUGAR. REPLACE EVERY 3 MONTHS 05/26/22   [provider]  Insulin Disposable Pump (OMNIPOD 5 G6 PODS, GEN 5,) MISC Inject into the skin.    [provider]  lisinopril (PRINIVIL,ZESTRIL) 20 MG tablet Take 10 mg by mouth daily.    [provider]  NOVOLOG 100 UNIT/ML injection SMARTSIG:0-100 Unit(s) SUB-Q Daily 07/07/22   [provider]    Physical Exam: Vitals:   10/06/22 1138 10/06/22 1530 10/06/22 1756 10/06/22 1835  BP:  116/78  128/70  Pulse:  (!) 102 (!) 109 (!) 105  Resp:  (!) 24 12 16   Temp:    98.4 F (36.9 C)  TempSrc:    Oral  SpO2:  100% 100% 98%  Weight: 68 kg      L: Patient does not appear to be in any distress he is coherently giving an account of his recent illness Respiratory; bilateral intravesicular Cardiovascular exam S1-S2 normal Abdomen soft nontender Extremities warm without edema Skin right thigh site of insulin pump appears to be  healthy.  Insulin pump has been removed by the patient. Data Reviewed:  Labs on Admission:  Results for orders placed or performed during the hospital encounter of 10/06/22 (from the past 24 hour(s))  CBG monitoring, ED     Status: Abnormal   Collection Time: 10/06/22 11:38 AM  Result Value Ref Range   Glucose-Capillary 479 (H) 70 - 99 mg/dL  Lipase, blood     Status: None   Collection Time: 10/06/22 11:41 AM  Result Value Ref Range   Lipase 20 11 - 51 U/L  Comprehensive metabolic panel     Status: Abnormal   Collection Time: 10/06/22 11:41 AM  Result Value Ref Range   Sodium 136 135 - 145 mmol/L   Potassium 4.0 3.5 - 5.1 mmol/L   Chloride 97 (L) 98 - 111 mmol/L   CO2 20 (L) 22 - 32 mmol/L   Glucose, Bld 542 (HH) 70 - 99 mg/dL   BUN 28 (H) 6 - 20 mg/dL   Creatinine, Ser 4.09 (H) 0.61 - 1.24 mg/dL   Calcium 9.5 8.9 - 81.1 mg/dL   Total Protein 7.9 6.5 - 8.1 g/dL   Albumin 5.2 (H) 3.5 - 5.0 g/dL   AST 48 (H) 15 - 41 U/L   ALT 52 (H) 0 - 44 U/L   Alkaline Phosphatase 188 (H) 38 - 126 U/L   Total Bilirubin 1.6 (H) 0.3 - 1.2 mg/dL   GFR, Estimated >91 >47 mL/min   Anion gap 19 (H) 5 - 15  CBC     Status: Abnormal   Collection Time: 10/06/22 11:41 AM  Result Value Ref Range   WBC 21.6 (H) 4.0 - 10.5 K/uL   RBC 5.00 4.22 - 5.81 MIL/uL   Hemoglobin 13.7 13.0 - 17.0 g/dL   HCT 82.9 56.2 - 13.0 %   MCV 83.2 80.0 - 100.0 fL   MCH 27.4 26.0 - 34.0 pg   MCHC 32.9 30.0 - 36.0 g/dL   RDW 86.5 78.4 - 69.6 %   Platelets 261 150 - 400 K/uL   nRBC 0.0 0.0 - 0.2 %  Urinalysis, Routine w reflex microscopic -Urine, Clean Catch     Status: Abnormal   Collection Time: 10/06/22 11:41 AM  Result Value Ref Range   Color, Urine STRAW (A) YELLOW   APPearance CLEAR (A) CLEAR   Specific Gravity, Urine 1.023 1.005 - 1.030   pH 5.0 5.0 - 8.0   Glucose, UA >=500 (A) NEGATIVE mg/dL   Hgb urine dipstick NEGATIVE NEGATIVE   Bilirubin Urine NEGATIVE NEGATIVE   Ketones, ur 20 (A) NEGATIVE mg/dL    Protein, ur NEGATIVE NEGATIVE mg/dL   Nitrite NEGATIVE NEGATIVE   Leukocytes,Ua NEGATIVE NEGATIVE   WBC, UA 0-5 0 - 5 WBC/hpf   Bacteria, UA NONE SEEN NONE SEEN   Squamous Epithelial / HPF 0-5 0 - 5 /HPF   Mucus PRESENT   Beta-hydroxybutyric acid     Status: Abnormal   Collection  Time: 10/06/22 11:41 AM  Result Value Ref Range   Beta-Hydroxybutyric Acid 3.02 (H) 0.05 - 0.27 mmol/L  CBG monitoring, ED     Status: Abnormal   Collection Time: 10/06/22  4:08 PM  Result Value Ref Range   Glucose-Capillary 526 (HH) 70 - 99 mg/dL   Comment 1 Call MD NNP PA CNM   Blood gas, venous     Status: Abnormal   Collection Time: 10/06/22  4:10 PM  Result Value Ref Range   pH, Ven 7.23 (L) 7.25 - 7.43   pCO2, Ven 49 44 - 60 mmHg   pO2, Ven 50 (H) 32 - 45 mmHg   Bicarbonate 20.5 20.0 - 28.0 mmol/L   Acid-base deficit 7.2 (H) 0.0 - 2.0 mmol/L   O2 Saturation 79.2 %   Patient temperature 37.0    Collection site VEIN   Lactic acid, plasma     Status: Abnormal   Collection Time: 10/06/22  4:10 PM  Result Value Ref Range   Lactic Acid, Venous 4.3 (HH) 0.5 - 1.9 mmol/L  Group A Strep by PCR     Status: None   Collection Time: 10/06/22  4:24 PM   Specimen: Throat; Sterile Swab  Result Value Ref Range   Group A Strep by PCR NOT DETECTED NOT DETECTED  CBG monitoring, ED     Status: Abnormal   Collection Time: 10/06/22  5:02 PM  Result Value Ref Range   Glucose-Capillary 395 (H) 70 - 99 mg/dL  CBG monitoring, ED     Status: Abnormal   Collection Time: 10/06/22  6:39 PM  Result Value Ref Range   Glucose-Capillary 236 (H) 70 - 99 mg/dL  Cardiac Enzymes: No results for input(s): "CKTOTAL", "CKMB", "CKMBINDEX", "TROPONINIHS" in the last 168 hours.  BNP (last 3 results) No results for input(s): "PROBNP" in the last 8760 hours. CBG: Recent Labs  Lab 10/06/22 1138 10/06/22 1608 10/06/22 1702 10/06/22 1839  GLUCAP 479* 526* 395* 236*    Radiological Exams on Admission:  No results  found.  Assessment and Plan: * DKA (diabetic ketoacidosis) (HCC) Agree with IV fluid boluses given in the ER and initiation of insulin infusion with basic metabolic panel monitoring every 4 hours with plan to transition from insulin infusion to subcutaneous insulin once anion gap is closed.  At that time patient's diet will be restarted.  Since the plan is to ultimately get the patient back on the insulin pump, I will start the patient on NPH 7 units every 8 hourly as basal replacement.  With regards to precipitating factors, my principal concern is that the patient has had a malfunction of his insulin pump.  However patient does have a marked leukocytosis and some elevation in LFTs.  Therefore I am checking a liver ultrasound chest x-ray and acute hepatitis panel.  Patient did report some rigors however these have resolved on their own.  No clinical focus of infection. Therefore I do not see a reason to start antibiotics at this time.  I will check the lactic acid level again which I believe is elevated due to dehydration.  As is the creatinine and BUN.  We will trend these.  Patient is looking nontoxic at this time.  Maintain on telemetry tonight      Advance Care Planning:   Code Status: Full Code   Consults: none  Family Communication: per patient.  Severity of Illness: The appropriate patient status for this patient is INPATIENT. Inpatient status is judged to be reasonable  and necessary in order to provide the required intensity of service to ensure the patient's safety. The patient's presenting symptoms, physical exam findings, and initial radiographic and laboratory data in the context of their chronic comorbidities is felt to place them at high risk for further clinical deterioration. Furthermore, it is not anticipated that the patient will be medically stable for discharge from the hospital within 2 midnights of admission.   * I certify that at the point of admission it is my clinical  judgment that the patient will require inpatient hospital care spanning beyond 2 midnights from the point of admission due to high intensity of service, high risk for further deterioration and high frequency of surveillance required.*  Author: Nolberto Hanlon, MD 10/06/2022 6:57 PM  For on call review [email or web address].

## 2022-10-07 ENCOUNTER — Inpatient Hospital Stay: Payer: BC Managed Care – PPO

## 2022-10-07 DIAGNOSIS — D1803 Hemangioma of intra-abdominal structures: Secondary | ICD-10-CM

## 2022-10-07 LAB — HEPATITIS PANEL, ACUTE
HCV Ab: NONREACTIVE
Hep A IgM: NONREACTIVE
Hep B C IgM: NONREACTIVE
Hepatitis B Surface Ag: NONREACTIVE

## 2022-10-07 LAB — CBG MONITORING, ED
Glucose-Capillary: 223 mg/dL — ABNORMAL HIGH (ref 70–99)
Glucose-Capillary: 144 mg/dL — ABNORMAL HIGH (ref 70–99)
Glucose-Capillary: 201 mg/dL — ABNORMAL HIGH (ref 70–99)
Glucose-Capillary: 167 mg/dL — ABNORMAL HIGH (ref 70–99)
Glucose-Capillary: 172 mg/dL — ABNORMAL HIGH (ref 70–99)
Glucose-Capillary: 200 mg/dL — ABNORMAL HIGH (ref 70–99)
Glucose-Capillary: 172 mg/dL — ABNORMAL HIGH (ref 70–99)
Glucose-Capillary: 211 mg/dL — ABNORMAL HIGH (ref 70–99)
Glucose-Capillary: 170 mg/dL — ABNORMAL HIGH (ref 70–99)
Glucose-Capillary: 156 mg/dL — ABNORMAL HIGH (ref 70–99)

## 2022-10-07 LAB — CBC
HCT: 33.5 % — ABNORMAL LOW (ref 39.0–52.0)
Hemoglobin: 11.2 g/dL — ABNORMAL LOW (ref 13.0–17.0)
MCH: 27.7 pg (ref 26.0–34.0)
MCHC: 33.4 g/dL (ref 30.0–36.0)
MCV: 82.7 fL (ref 80.0–100.0)
Platelets: 214 10*3/uL (ref 150–400)
RBC: 4.05 MIL/uL — ABNORMAL LOW (ref 4.22–5.81)
RDW: 12.8 % (ref 11.5–15.5)
WBC: 20.3 10*3/uL — ABNORMAL HIGH (ref 4.0–10.5)
nRBC: 0 % (ref 0.0–0.2)

## 2022-10-07 LAB — HEPATIC FUNCTION PANEL
ALT: 39 U/L (ref 0–44)
AST: 27 U/L (ref 15–41)
Albumin: 3.9 g/dL (ref 3.5–5.0)
Alkaline Phosphatase: 135 U/L — ABNORMAL HIGH (ref 38–126)
Bilirubin, Direct: 0.2 mg/dL (ref 0.0–0.2)
Indirect Bilirubin: 0.8 mg/dL (ref 0.3–0.9)
Total Bilirubin: 1 mg/dL (ref 0.3–1.2)
Total Protein: 6.1 g/dL — ABNORMAL LOW (ref 6.5–8.1)

## 2022-10-07 LAB — BASIC METABOLIC PANEL
Anion gap: 7 (ref 5–15)
BUN: 23 mg/dL — ABNORMAL HIGH (ref 6–20)
CO2: 26 mmol/L (ref 22–32)
Calcium: 8.5 mg/dL — ABNORMAL LOW (ref 8.9–10.3)
Chloride: 106 mmol/L (ref 98–111)
Creatinine, Ser: 0.99 mg/dL (ref 0.61–1.24)
GFR, Estimated: >60 mL/min (ref >60)
Glucose, Bld: 172 mg/dL — ABNORMAL HIGH (ref 70–99)
Potassium: 3.7 mmol/L (ref 3.5–5.1)
Sodium: 139 mmol/L (ref 135–145)

## 2022-10-07 LAB — LACTIC ACID, PLASMA: Lactic Acid, Venous: 1.2 mmol/L (ref 0.5–1.9)

## 2022-10-07 LAB — CULTURE, BLOOD (ROUTINE X 2): Special Requests: ADEQUATE

## 2022-10-07 LAB — BETA-HYDROXYBUTYRIC ACID: Beta-Hydroxybutyric Acid: 1.01 mmol/L — ABNORMAL HIGH (ref 0.05–0.27)

## 2022-10-07 MED ORDER — IOHEXOL 300 MG/ML  SOLN
100.0000 mL | Freq: Once | INTRAMUSCULAR | Status: AC | PRN
  Administered 2022-10-07: 100 mL via INTRAVENOUS

## 2022-10-07 MED ORDER — INSULIN ASPART 100 UNIT/ML IJ SOLN: 0.0000 [IU] | Freq: Every day | INTRAMUSCULAR | Status: DC

## 2022-10-07 MED ORDER — LISINOPRIL 20 MG PO TABS: 10.0000 mg | ORAL_TABLET | Freq: Every day | ORAL | 0 refills | Status: AC

## 2022-10-07 MED ORDER — INSULIN ASPART 100 UNIT/ML IJ SOLN
0.0000 [IU] | Freq: Three times a day (TID) | INTRAMUSCULAR | Status: DC
  Administered 2022-10-07: 5 [IU] via SUBCUTANEOUS
  Filled 2022-10-07: qty 1

## 2022-10-07 MED ORDER — INSULIN DETEMIR 100 UNIT/ML ~~LOC~~ SOLN
7.0000 [IU] | Freq: Three times a day (TID) | SUBCUTANEOUS | Status: DC
  Filled 2022-10-07 (×2): qty 0.07

## 2022-10-07 MED ORDER — DEXTROSE IN LACTATED RINGERS 5 % IV SOLN: INTRAVENOUS | Status: DC

## 2022-10-07 MED ORDER — INSULIN PUMP
Freq: Three times a day (TID) | SUBCUTANEOUS | Status: DC
  Filled 2022-10-07: qty 1

## 2022-10-07 NOTE — ED Notes (Signed)
Pt pump started at "basal 1" 1u/h.   Pt has paper and pen for documenting

## 2022-10-07 NOTE — Inpatient Diabetes Management (Signed)
Inpatient Diabetes Program Recommendations  AACE/ADA: New Consensus Statement on Inpatient Glycemic Control (2015)  Target Ranges:  Prepandial:   less than 140 mg/dL      Peak postprandial:   less than 180 mg/dL (1-2 hours)      Critically ill patients:  140 - 180 mg/dL   Lab Results  Component Value Date   GLUCAP 156 (H) 10/07/2022   HGBA1C 9.1 07/22/2022    Review of Glycemic Control  Diabetes history: DM type 1 Outpatient Diabetes medications: Omnipod insulin pump with Novolog insulin Current orders for Inpatient glycemic control: IV insulin endotool/DKA  Sees Dr. Tedd Sias, Endocrinologist outpatient for Diabetes management  Was called by RN for transition of patient. Recommend placing pt on IV insulin until insulin pump supplies are obtained by family. Pt to restart insulin pump and overlap insulin gtt 1 hour after insulin pump is restarted.   Spoke with pt over the phone. Pt reports pump is in good operating condition he was just unable to get glucose levels down. Explained to pt that in the future restarting a new pump site when glucose trends increase is an option in addition to injecting back up insulin SQ with a needle to get glucose trends down. Explained to pt he would need to monitor his glucose frequently in situations like this and to have a plan for back up insulin with Dr. Tedd Sias in the future.  Thanks,  Christena Deem RN, MSN, BC-ADM Inpatient Diabetes Coordinator Team Pager [phone] (8a-5p)

## 2022-10-07 NOTE — ED Notes (Signed)
Pharmacy called regarding levemir as endo tool was not giving transition recommendation as they are locked.

## 2022-10-07 NOTE — ED Notes (Signed)
Pt family member arrived with his pump.  Call to diabetes coordinator to get transition orders to transition to insulin pump

## 2022-10-07 NOTE — ED Notes (Signed)
Call to diabetes coordinator to help with transition and make recommendation on Levemir dose and best transition

## 2022-10-07 NOTE — ED Notes (Signed)
Pt gave himself 2.8unit bolus to cover carbs from banana (copied from his patient log)

## 2022-10-07 NOTE — Discharge Summary (Addendum)
Physician Discharge Summary   Patient: Richard Bray MRN: [id number] DOB: Mar 13, 1981  Admit date:     10/06/2022  Discharge date: 10/07/22  Discharge Physician: Enedina Finner   PCP: Margarita Mail, DO   Recommendations at discharge:   Use your insulin pump and manages sugars according to your protocol. Follow-up endocrinology Dr. Tedd Sias on your upcoming appointment   Discharge Diagnoses: Principal Problem:   DKA (diabetic ketoacidosis) (HCC)   Richard Bray is a 42 y.o. male with medical history significant of type 1 diabetes mellitus for which patient uses insulin pump and continuous blood glucose monitor.  Patient reports that for the past 1 week there have been several instances when his blood glucose monitor has been unable to get glucose reading.   Diabetic ketoacidosis in type I diabetes on insulin pump -- patient came in with critical high sugar, nausea and vomiting. -- Triggering factors unclear. -- Patient was started on IV insulin drip per Endo tool. He is converted now and with help of diabetes coordinator placed back on insulin pump -- tolerating PO diet. No nausea vomiting -- patient advised to monitor sugars at home and use pump according to his plan. -- He follows with Dr. Tedd Sias endocrinology and has upcoming appointment -- elevated white count appears reactive. Patient does not have any source of infection.  Hemangiomas of the liver-- chronic -- patient has history of hemangiomas. Discussed results of ultrasound and need for CT abdomen with contrast given MRI not able to be done with insulin pump. -- Patient was agreeable to get CT scan done. Results were shared. Advised patient to get follow-up radiological study in 6 to 12 months defer to PCP.  ER stay otherwise was unremarkable. Patient will be discharged to home. About was discussed with patient's wife at bedside. Questions answered.      Diet recommendation:  Discharge Diet Orders (From admission,  onward)     Start     Ordered   10/07/22 0000  Diet - low sodium heart healthy        10/07/22 1408   10/07/22 0000  Diet Carb Modified        10/07/22 1408            DISCHARGE MEDICATION: Allergies as of 10/07/2022       Reactions   Metformin Other (See Comments)   GI Upset        Medication List     TAKE these medications    Dexcom G6 Sensor Misc SMARTSIG:Topical Every 10 Days   Dexcom G6 Transmitter Misc USE TO MONITOR BLOOD SUGAR. REPLACE EVERY 3 MONTHS   lisinopril 20 MG tablet Commonly known as: ZESTRIL Take 0.5 tablets (10 mg total) by mouth daily.   NovoLOG 100 UNIT/ML injection Generic drug: insulin aspart SMARTSIG:0-100 Unit(s) SUB-Q Daily   Omnipod 5 G6 Pods (Gen 5) Misc Inject into the skin.        Follow-up Information     Margarita Mail, DO. Schedule an appointment as soon as possible for a visit in 1 week(s).   Specialty: Internal Medicine Contact information: [address] [place] [address] [phone]         Raj Janus, MD. Go to.   Specialty: Endocrinology Why: on your scheduled appointment Contact information: [address] [facility] [place] [address] [phone]                Alert and oriented times three not in acute distress  respiratory clear to auscultation no wheezing cardiovascular both heart sounds normal rate rhythm regular no murmur abdomen soft benign nontender no organomegaly.  Condition at discharge: fair  The results of significant diagnostics from this hospitalization (including imaging, microbiology, ancillary and laboratory) are listed below for reference.   Imaging Studies: CT ABDOMEN W CONTRAST  Result Date: 10/07/2022 CLINICAL DATA:  Liver lesions seen on recent ultrasound examination. EXAM: CT ABDOMEN WITH CONTRAST TECHNIQUE: Multidetector CT imaging of the abdomen was performed using the standard protocol following bolus  administration of intravenous contrast. RADIATION DOSE REDUCTION: This exam was performed according to the departmental dose-optimization program which includes automated exposure control, adjustment of the mA and/or kV according to patient size and/or use of iterative reconstruction technique. CONTRAST:  OMNIPAQUE IOHEXOL 300 MG/ML  SOLN COMPARISON:  CT scan 11/01/2014 FINDINGS: Lower chest: The lung bases are clear of acute process. No pleural effusion or pulmonary lesions. The heart is normal in size. No pericardial effusion. The distal esophagus and aorta are unremarkable. Hepatobiliary: There are several hepatic hemangiomas as demonstrated on prior imaging studies. Lateral segment 7 lesion measures 16 mm. Medial segment 7 lesion measures 20 mm. Between these 2 lesions in segment 7 is an area of branching low attenuation with some vessels running through it. This measures approximately 2.5 x 2.5 cm. It does not have the appearance of a hemangioma. It could be a cryptic vascular malformation or focal fatty change or both. In any event there was present on the prior CT scan from 2016 and is unchanged and considered benign. Segment 3 hemangioma measures 2.7 cm. There is an adjacent smaller lesion measuring 17 mm image 45/2 with more central appearing focus of enhancement with the surrounding enhancement. This could also be some type of vascular malformation closely associated with a branching vessel. Pancreas: Advanced pancreatic atrophy for age. No inflammation, mass or ductal dilatation. Spleen: Normal size.  No focal lesions. Adrenals/Urinary Tract: Adrenal glands and kidneys are. Stomach/Bowel: The stomach, duodenum visualized small visualized. Vascular/Lymphatic: The aorta is normal in caliber. No dissection. The branch vessels are patent. The major venous structures are patent. No mesenteric or retroperitoneal mass or adenopathy. Small scattered lymph nodes are noted. Other: No ascites. Small  periumbilical abdominal wall hernia containing fat. Musculoskeletal: No significant bony findings. IMPRESSION: 1. Multiple hepatic lesions as detailed above. There are for benign hepatic hemangiomas and 2 indeterminate lesions, 1 in segment 7 and 1 and the lateral aspect of segment 3. These are also likely benign but I would recommend a follow-up MRI abdomen without and with contrast in 6-12 months to re-evaluate. 2. 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 3. No acute abdominal findings or adenopathy. Electronically Signed   By: Rudie Meyer M.D.   On: 10/07/2022 13:45   US Abdomen Limited RUQ (LIVER/GB)  Result Date: 10/07/2022 CLINICAL DATA:  Elevated liver function tests EXAM: ULTRASOUND ABDOMEN LIMITED RIGHT UPPER QUADRANT COMPARISON:  MRCP 11/02/2014 FINDINGS: Gallbladder: Interval cholecystectomy Common bile duct: Diameter: 5 mm in proximal diameter Liver: Hepatic parenchymal echogenicity is within normal limits. Within the segment 7 and likely segment 3 of the liver are 2 homogeneously hyperechoic lesions measuring 2.3 cm and 9 mm, respectively in keeping with benign cavernous hemangioma. However, since the prior examination, a 3.0 x 2.1 x 2.6 cm heterogeneously hypoechoic indeterminate mass is developed within the lateral segment of the left hepatic lobe. An additional poorly circumscribed hyperechoic lesion has developed within the posterior right hepatic lobe measuring roughly 2.4 x 2.3 x  2.4 cm. These are indeterminate on this examination. No intrahepatic biliary ductal dilation. Portal vein is patent on color Doppler imaging with normal direction of blood flow towards the liver. Other: None. IMPRESSION: 1. Interval cholecystectomy. 2. Interval development of 2 indeterminate lesions within the liver, one within the lateral segment of the left hepatic lobe and one within the posterior right hepatic lobe. Further evaluation with contrast-enhanced abdominal MRI is recommended. 3. Two benign cavernous  hemangiomas within the liver. Electronically Signed   By: Helyn Numbers M.D.   On: 10/07/2022 01:26   DG Chest 2 View  Result Date: 10/06/2022 CLINICAL DATA:  Leukocytosis EXAM: CHEST - 2 VIEW COMPARISON:  10/31/2014 FINDINGS: The heart size and mediastinal contours are within normal limits. No focal airspace consolidation, pleural effusion, or pneumothorax. Mild scoliotic thoracic curvature. IMPRESSION: No active cardiopulmonary disease. Electronically Signed   By: Duanne Guess D.O.   On: 10/06/2022 20:55    Microbiology: Results for orders placed or performed during the hospital encounter of 10/06/22  Group A Strep by PCR     Status: None   Collection Time: 10/06/22  4:24 PM   Specimen: Throat; Sterile Swab  Result Value Ref Range Status   Group A Strep by PCR NOT DETECTED NOT DETECTED Final    Comment: Performed at [facility], [address] [place], [place] [address]  Culture, blood (Routine X 2) w Reflex to ID Panel     Status: None (Preliminary result)   Collection Time: 10/06/22  6:58 PM   Specimen: BLOOD  Result Value Ref Range Status   Specimen Description BLOOD BLOOD LEFT ARM  Final   Special Requests   Final    BOTTLES DRAWN AEROBIC AND ANAEROBIC Blood Culture adequate volume   Culture   Final    NO GROWTH < 12 HOURS Performed at [facility], [address] [place], [place] [address]    Report Status PENDING  Incomplete  Culture, blood (Routine X 2) w Reflex to ID Panel     Status: None (Preliminary result)   Collection Time: 10/06/22  8:03 PM   Specimen: BLOOD  Result Value Ref Range Status   Specimen Description BLOOD BLOOD RIGHT ARM  Final   Special Requests   Final    BOTTLES DRAWN AEROBIC AND ANAEROBIC Blood Culture adequate volume   Culture   Final    NO GROWTH < 12 HOURS Performed at [facility], [address] [place], [place] [address]    Report Status PENDING  Incomplete    Labs: CBC: Recent Labs  Lab  10/06/22 1141 10/06/22 2101 10/07/22 0751  WBC 21.6* 23.6* 20.3*  NEUTROABS  --  21.1*  --   HGB 13.7 12.0* 11.2*  HCT 41.6 36.1* 33.5*  MCV 83.2 83.0 82.7  PLT 261 233 214   Basic Metabolic Panel: Recent Labs  Lab 10/06/22 1141 10/06/22 2043 10/07/22 0445  NA 136 140 139  K 4.0 3.7 3.7  CL 97* 106 106  CO2 20* 23 26  GLUCOSE 542* 257* 172*  BUN 28* 26* 23*  CREATININE 1.25* 1.06 0.99  CALCIUM 9.5 9.1 8.5*   Liver Function Tests: Recent Labs  Lab 10/06/22 1141 10/07/22 0445  AST 48* 27  ALT 52* 39  ALKPHOS 188* 135*  BILITOT 1.6* 1.0  PROT 7.9 6.1*  ALBUMIN 5.2* 3.9   CBG: Recent Labs  Lab 10/07/22 0739 10/07/22 0836 10/07/22 0950 10/07/22 1100 10/07/22 1159  GLUCAP 200* 172* 211* 170*  156*    Discharge time spent: greater than 30 minutes.  Signed: Enedina Finner, MD [facility] 10/07/2022

## 2022-10-07 NOTE — ED Notes (Signed)
Pt continued on endo tool at this time as we await family to bring his insulin pump.  Admitting MD notified.

## 2022-10-07 NOTE — ED Notes (Addendum)
Pt gave himself 2.8unit bolus to cover lunch tray (copied from patient log)

## 2022-10-07 NOTE — ED Notes (Signed)
Patient transported to CT 

## 2022-10-07 NOTE — ED Notes (Signed)
IV insulin stopped at this time.  Pt is feeling well and managing his insulin pump and documenting boluses on patient log.

## 2022-10-07 NOTE — ED Notes (Signed)
Pt signed "patient contract for insulin pump therary" reviewed with pt that he will be documenting his bolus, rate and that hospital will be checking CBG.  Pt applied insulin pump at this time.

## 2022-10-08 ENCOUNTER — Telehealth: Payer: Self-pay

## 2022-10-08 LAB — CULTURE, BLOOD (ROUTINE X 2)

## 2022-10-08 NOTE — Transitions of Care (Post Inpatient/ED Visit) (Signed)
   10/08/2022  Name: SEMAJAY SINGLE MRN: [id number] DOB: 1980/07/13  Today's TOC FU Call Status: Today's TOC FU Call Status:: Successful TOC FU Call Competed TOC FU Call Complete Date: 10/08/22  Transition Care Management Follow-up Telephone Call Date of Discharge: 10/07/22 Discharge Facility: [facility]) Type of Discharge: Inpatient Admission Primary Inpatient Discharge Diagnosis:: DM with Ketoacidosis How have you been since you were released from the hospital?: Better Any questions or concerns?: No  Items Reviewed: Did you receive and understand the discharge instructions provided?: Yes Medications obtained,verified, and reconciled?: Yes (Medications Reviewed) Any new allergies since your discharge?: No Dietary orders reviewed?: Yes Do you have support at home?: Yes People in Home: spouse  Medications Reviewed Today: Medications Reviewed Today     Reviewed by Karena Addison, LPN (Licensed Practical Nurse) on 10/08/22 at [phone]  Med List Status: <None>   Medication Order Taking? Sig Documenting Provider Last Dose Status Informant  Continuous Blood Gluc Sensor (DEXCOM G6 SENSOR) MISC [id number] Yes SMARTSIG:Topical Every 10 Days [provider] Taking Active Self  Continuous Blood Gluc Transmit (DEXCOM G6 TRANSMITTER) MISC [id number] Yes USE TO MONITOR BLOOD SUGAR. REPLACE EVERY 3 MONTHS [provider] Taking Active Self  Insulin Disposable Pump (OMNIPOD 5 G6 PODS, GEN 5,) MISC [id number] Yes Inject into the skin. [provider] Taking Active Self  lisinopril (ZESTRIL) 20 MG tablet [id number] Yes Take 0.5 tablets (10 mg total) by mouth daily. Enedina Finner, MD Taking Active   NOVOLOG 100 UNIT/ML injection [id number] Yes SMARTSIG:0-100 Unit(s) SUB-Q Daily [provider] Taking Active Self            Home Care and Equipment/Supplies: Were Home Health Services Ordered?: NA Any new equipment or medical supplies  ordered?: NA  Functional Questionnaire: Do you need assistance with bathing/showering or dressing?: No Do you need assistance with meal preparation?: No Do you need assistance with eating?: No Do you have difficulty maintaining continence: No Do you need assistance with getting out of bed/getting out of a chair/moving?: No Do you have difficulty managing or taking your medications?: No  Follow up appointments reviewed: PCP Follow-up appointment confirmed?: No (no avail appt times, sent message to staff to schedule) MD Provider Line Number:[phone] Given: No Specialist Hospital Follow-up appointment confirmed?: Yes Date of Specialist follow-up appointment?: 10/23/22 Follow-Up Specialty Provider:: ENdo Do you need transportation to your follow-up appointment?: No Do you understand care options if your condition(s) worsen?: Yes-patient verbalized understanding    SIGNATURE Karena Addison, LPN [facility] Nurse Health Advisor Direct Dial [phone]

## 2022-10-09 LAB — CULTURE, BLOOD (ROUTINE X 2): Culture: NO GROWTH

## 2022-10-10 LAB — CULTURE, BLOOD (ROUTINE X 2): Culture: NO GROWTH

## 2022-10-11 ENCOUNTER — Emergency Department
Admission: EM | Admit: 2022-10-11 | Discharge: 2022-10-11 | Disposition: A | Discharge status: Home / Self Care | Payer: BC Managed Care – PPO | Attending: Emergency Medicine | Admitting: Emergency Medicine

## 2022-10-11 ENCOUNTER — Other Ambulatory Visit: Payer: Self-pay

## 2022-10-11 DIAGNOSIS — S51811A Laceration without foreign body of right forearm, initial encounter: Secondary | ICD-10-CM | POA: Diagnosis not present

## 2022-10-11 DIAGNOSIS — S51831A Puncture wound without foreign body of right forearm, initial encounter: Secondary | ICD-10-CM | POA: Diagnosis not present

## 2022-10-11 DIAGNOSIS — Z23 Encounter for immunization: Secondary | ICD-10-CM | POA: Insufficient documentation

## 2022-10-11 DIAGNOSIS — W540XXA Bitten by dog, initial encounter: Secondary | ICD-10-CM | POA: Insufficient documentation

## 2022-10-11 DIAGNOSIS — S51851A Open bite of right forearm, initial encounter: Secondary | ICD-10-CM | POA: Diagnosis not present

## 2022-10-11 LAB — CULTURE, BLOOD (ROUTINE X 2): Special Requests: ADEQUATE

## 2022-10-11 MED ORDER — TETANUS-DIPHTH-ACELL PERTUSSIS 5-2.5-18.5 LF-MCG/0.5 IM SUSY
0.5000 mL | PREFILLED_SYRINGE | Freq: Once | INTRAMUSCULAR | Status: AC
  Administered 2022-10-11: 0.5 mL via INTRAMUSCULAR
  Filled 2022-10-11: qty 0.5

## 2022-10-11 MED ORDER — AMOXICILLIN-POT CLAVULANATE 875-125 MG PO TABS: 1.0000 | ORAL_TABLET | Freq: Two times a day (BID) | ORAL | 0 refills | Status: AC

## 2022-10-11 MED ORDER — LIDOCAINE-EPINEPHRINE (PF) 2 %-1:200000 IJ SOLN
20.0000 mL | Freq: Once | INTRAMUSCULAR | Status: AC
  Administered 2022-10-11: 20 mL via INTRADERMAL
  Filled 2022-10-11: qty 20

## 2022-10-11 MED ORDER — AMOXICILLIN-POT CLAVULANATE 875-125 MG PO TABS
1.0000 | ORAL_TABLET | Freq: Once | ORAL | Status: AC
  Administered 2022-10-11: 1 via ORAL
  Filled 2022-10-11: qty 1

## 2022-10-11 NOTE — ED Triage Notes (Signed)
Pt to ED from home for dog bite to right arm. Pt dogs were fighting and he tried to break it up and got caught in the middle. Pt is CAOx4, in no acute distress and ambulatory in triage.  Pt has several puncture wounds to right forearm with bleeding controlled in triage, bandaged.

## 2022-10-11 NOTE — ED Notes (Signed)
Lexicographer is at bedside.

## 2022-10-11 NOTE — ED Provider Notes (Signed)
[facility] Provider Note    Event Date/Time   First MD Initiated Contact with Patient 10/11/22 1757     (approximate)   History   Animal Bite (DOG)   HPI  Richard Bray is a 42 y.o. male who presents after sustaining animal bite as he tried to break up a fight between his 2 pit bulls.  Patient reports they all have been vaccinated against rabies.  Sustained injury to the right arm     Physical Exam   Triage Vital Signs: ED Triage Vitals  Enc Vitals Group     BP 10/11/22 1752 (!) 147/84     Pulse Rate 10/11/22 1752 95     Resp 10/11/22 1752 16     Temp 10/11/22 1752 98.5 F (36.9 C)     Temp Source 10/11/22 1752 Oral     SpO2 10/11/22 1752 96 %     Weight 10/11/22 1753 77.1 kg (170 lb)     Height 10/11/22 1753 1.829 m (6')     Head Circumference --      Peak Flow --      Pain Score 10/11/22 1753 10     Pain Loc --      Pain Edu? --      Excl. in GC? --     Most recent vital signs: Vitals:   10/11/22 1752  BP: (!) 147/84  Pulse: 95  Resp: 16  Temp: 98.5 F (36.9 C)  SpO2: 96%     General: Awake, no distress.  CV:  Good peripheral perfusion.  Resp:  Normal effort.  Abd:  No distention.  Other:  Right forearm: 6 puncture wounds, 3 on the dorsal aspect, 3 on the volar aspect, one wound on the dorsal aspect is somewhat gaping, no foreign bodies.  Normal range of motion of the fingers and hand and wrist   ED Results / Procedures / Treatments   Labs (all labs ordered are listed, but only abnormal results are displayed) Labs Reviewed - No data to display   EKG     RADIOLOGY     PROCEDURES:  Critical Care performed:   Marland KitchenMarland KitchenLaceration Repair  Date/Time: 10/11/2022 6:30 PM  Performed by: Jene Every, MD Authorized by: Jene Every, MD   Consent:    Consent obtained:  Verbal   Consent given by:  Patient Anesthesia:    Anesthesia method:  Local infiltration   Local anesthetic:  Lidocaine 2% WITH  epi Laceration details:    Location:  Shoulder/arm   Shoulder/arm location:  R lower arm   Length (cm):  1 Treatment:    Area cleansed with:  Povidone-iodine   Amount of cleaning:  Extensive   Irrigation method:  Pressure wash   Visualized foreign bodies/material removed: no   Skin repair:    Repair method:  Sutures   Suture size:  4-0   Suture material:  Prolene   Suture technique:  Simple interrupted Approximation:    Approximation:  Loose Post-procedure details:    Dressing:  Bulky dressing   Procedure completion:  Tolerated well, no immediate complications    MEDICATIONS ORDERED IN ED: Medications  lidocaine-EPINEPHrine (XYLOCAINE W/EPI) 2 %-1:200000 (PF) injection 20 mL (has no administration in time range)  Tdap (BOOSTRIX) injection 0.5 mL (has no administration in time range)  amoxicillin-clavulanate (AUGMENTIN) 875-125 MG per tablet 1 tablet (has no administration in time range)     IMPRESSION / MDM / ASSESSMENT AND PLAN / ED COURSE  I reviewed the triage vital signs and the nursing notes. Patient's presentation is most consistent with acute, uncomplicated illness.  Patient presents with dog bite to the right forearm as above.  Rabies vaccination is known, patient owns the dogs.  Animal control was contacted by nurse  Wounds extensively cleaned, the majority of which are not amenable to repair and do not need repair.  1 bite wound was somewhat gaping and a single suture was placed to loosely close.  Will start the patient on Augmentin  Tetanus updated        FINAL CLINICAL IMPRESSION(S) / ED DIAGNOSES   Final diagnoses:  Dog bite, initial encounter     Rx / DC Orders   ED Discharge Orders          Ordered    amoxicillin-clavulanate (AUGMENTIN) 875-125 MG tablet  2 times daily        10/11/22 1832             Note:  This document was prepared using Dragon voice recognition software and may include unintentional dictation errors.   Jene Every, MD 10/11/22 [phone]

## 2022-10-11 NOTE — ED Notes (Signed)
Call placed to animal control

## 2022-10-14 NOTE — Progress Notes (Unsigned)
Established Patient Office Visit  Subjective    Patient ID: Richard Bray, male    DOB: 04-Jun-1980  Age: 42 y.o. MRN: [id number]  CC:  No chief complaint on file.   HPI Richard Bray presents for hospital follow up.   Discharge Date: 10/07/22 Diagnosis: DKA Procedures/tests: sugar 526 on admission, WBC 20.3, hgb 11.2. Chest x-ray negative. Abdominal US showing 2 indeterminate lesion within the liver and 2 benign cavernous hemangiomas. CT showing multiple hepativ lesion, likely benign but recommend MRI in 6 months.  Consultants: Endo New medications: None Discontinued medications: None Discharge instructions:  Follow up with  Status: {Blank multiple:19196::"better","worse","stable","fluctuating"}   Diabetes, Type 1: -Following with Endocrinology at [facility], last seen 07/22/22. Following up in 3 months.  -Has Omnipod 5 insulin pump since August  -Last A1c 3/24 9.1% -Medications: basal insulin 24 units in 24 hours, bolus about 15 g/unit, Lisinopril 20 mg  -Patient is compliant with the above medications and reports no side effects.  -Checking BG at home: has Dexcom -Eye exam: UTD 12/23 -Microalbumin: UTD 3/24 -Statin: No, last LDL 96 2/23 -PNA vaccine: prevnar 23 in 2013 -Denies symptoms of hypoglycemia, polyuria, polydipsia, numbness extremities, foot ulcers/trauma.   Right Trigger Finger: -Going on the last 6 months or so - located on his right third digit -Patient is right handed, works with drills with some repetitive motions -Does have pain more so in the MCP joint -Finger can bend but will get stuck and can't unbend  Insomnia: -Works second shift -Sleeps about 4-5 hours -Has trouble falling asleep but can usually stay asleep without issue -Is working on sleep hygiene items like using black out curtains and ambient noise -Does not take any sleep aids   Health Maintenance: -Blood work due   Outpatient Encounter Medications as of 10/15/2022  Medication Sig    amoxicillin-clavulanate (AUGMENTIN) 875-125 MG tablet Take 1 tablet by mouth 2 (two) times daily for 7 days.   Continuous Blood Gluc Sensor (DEXCOM G6 SENSOR) MISC SMARTSIG:Topical Every 10 Days   Continuous Blood Gluc Transmit (DEXCOM G6 TRANSMITTER) MISC USE TO MONITOR BLOOD SUGAR. REPLACE EVERY 3 MONTHS   Insulin Disposable Pump (OMNIPOD 5 G6 PODS, GEN 5,) MISC Inject into the skin.   lisinopril (ZESTRIL) 20 MG tablet Take 0.5 tablets (10 mg total) by mouth daily.   NOVOLOG 100 UNIT/ML injection SMARTSIG:0-100 Unit(s) SUB-Q Daily   No facility-administered encounter medications on file as of 10/15/2022.    Past Medical History:  Diagnosis Date   Diabetes mellitus    Type 1 diabetes mellitus (HCC)    Diagnosed at 42 yo with DKA / ICU admission    Past Surgical History:  Procedure Laterality Date   CHOLECYSTECTOMY N/A 11/03/2014   Procedure: Laparoscopic cholecystectomy ;  Surgeon: Lattie Haw, MD;  Location: [facility] ORS;  Service: General;  Laterality: N/A;   ESOPHAGOGASTRODUODENOSCOPY Left 11/02/2014   Procedure: ESOPHAGOGASTRODUODENOSCOPY (EGD);  Surgeon: Wallace Cullens, MD;  Location: [facility] ENDOSCOPY;  Service: Endoscopy;  Laterality: Left;    Family History  Problem Relation Age of Onset   Sickle cell anemia Father    Aneurysm Mother        Cerebral     Social History   Socioeconomic History   Marital status: Married    Spouse name: Not on file   Number of children: Not on file   Years of education: Not on file   Highest education level: Not on file  Occupational History   Not on  file  Tobacco Use   Smoking status: Never   Smokeless tobacco: Never  Substance and Sexual Activity   Alcohol use: No   Drug use: Yes    Types: Marijuana    Comment: Marijuana every other day    Sexual activity: Yes  Other Topics Concern   Not on file  Social History Narrative   Still active, gets around without cane or walker. Lives at home with wife, no children. Unemployed. Never  smoker, no alcohol.    Social Determinants of Health   Financial Resource Strain: Not on file  Food Insecurity: Not on file  Transportation Needs: Not on file  Physical Activity: Not on file  Stress: Not on file  Social Connections: Not on file  Intimate Partner Violence: Not on file    Review of Systems  All other systems reviewed and are negative.       Objective    There were no vitals taken for this visit.  Physical Exam Constitutional:      Appearance: Normal appearance.  HENT:     Head: Normocephalic and atraumatic.     Mouth/Throat:     Mouth: Mucous membranes are moist.     Comments: Mild PND present Eyes:     Extraocular Movements: Extraocular movements intact.     Conjunctiva/sclera: Conjunctivae normal.     Pupils: Pupils are equal, round, and reactive to light.  Cardiovascular:     Rate and Rhythm: Normal rate and regular rhythm.  Pulmonary:     Effort: Pulmonary effort is normal.     Breath sounds: Normal breath sounds.  Musculoskeletal:        General: Tenderness present.     Right lower leg: No edema.     Left lower leg: No edema.     Comments: Right trigger finger present, moderate  Skin:    General: Skin is warm and dry.  Neurological:     General: No focal deficit present.     Mental Status: He is alert. Mental status is at baseline.  Psychiatric:        Mood and Affect: Mood normal.        Behavior: Behavior normal.         Assessment & Plan:   1. Type 1 diabetes mellitus with hyperglycemia (HCC)/Lipid screening: Follows with Endocrinology at [facility], note and labs from 07/22/22 reviewed. Will check lipid panel for this year.   - CBC w/Diff/Platelet - Lipid Profile  2. Elevated carbon dioxide level: Smokes occasionally, wife smokes. Does have a family history of sickle cell anemia in his father. Will obtain CBC to assess hemoglobin today.   - CBC w/Diff/Platelet  3. Trigger middle finger of right hand: Mild to moderate in severity  but does cause him some pain. Conservative management discussed, also discussed potential for trigger finger release. Information printed for the patient.   4. Other insomnia: Discussed sleep hygiene tactics. He works second shift which is the primary cause of his insomnia. Recommend starting with over the counter melatonin and rapid eye movement before bed. Follow up if symptoms progress.    No follow-ups on file.   Margarita Mail, DO

## 2022-10-15 ENCOUNTER — Encounter: Payer: Self-pay | Admitting: Internal Medicine

## 2022-10-15 ENCOUNTER — Ambulatory Visit: Payer: BC Managed Care – PPO | Admitting: Internal Medicine

## 2022-10-15 VITALS — BP 124/72 | HR 89 | Temp 98.0°F | Resp 18 | Ht 72.0 in | Wt 163.4 lb

## 2022-10-15 DIAGNOSIS — Z09 Encounter for follow-up examination after completed treatment for conditions other than malignant neoplasm: Secondary | ICD-10-CM | POA: Diagnosis not present

## 2022-10-15 DIAGNOSIS — E1065 Type 1 diabetes mellitus with hyperglycemia: Secondary | ICD-10-CM | POA: Diagnosis not present

## 2022-10-15 DIAGNOSIS — K769 Liver disease, unspecified: Secondary | ICD-10-CM

## 2022-10-15 DIAGNOSIS — W540XXD Bitten by dog, subsequent encounter: Secondary | ICD-10-CM | POA: Diagnosis not present

## 2022-10-17 NOTE — Addendum Note (Signed)
Addended by: Dusan Lipford on: 10/17/2022 01:34 PM   Modules accepted: Level of Service  

## 2022-10-19 NOTE — Progress Notes (Unsigned)
Established Patient Office Visit  Subjective    Patient ID: Richard Bray, male    DOB: November 14, 1980  Age: 42 y.o. MRN: [id number]  CC:  No chief complaint on file.   HPI Richard Bray presents for suture removal.   He was seen in the ER on 10/11/22 for dog bite. He had to break up a fight between his 2 dogs when he sustained a bite to his right forearm. The dogs were up to date with vaccinations. He was treated with Augmentin and has one suture that needs to be removed today.  Diabetes, Type 1: -Following with Endocrinology at [facility], last seen 07/22/22. Following up in 3 months.  -Has Omnipod 5 insulin pump since August  -Last A1c 3/24 9.1% -Medications: basal insulin 24 units in 24 hours, bolus about 15 g/unit, Lisinopril 20 mg  -Patient is compliant with the above medications and reports no side effects.  -Checking BG at home: has Dexcom -Eye exam: UTD 12/23 -Microalbumin: UTD 3/24 -Statin: No, last LDL 96 2/23 -PNA vaccine: prevnar 23 in 2013 -Denies symptoms of hypoglycemia, polyuria, polydipsia, numbness extremities, foot ulcers/trauma.   Right Trigger Finger: -Going on the last 6 months or so - located on his right third digit -Patient is right handed, works with drills with some repetitive motions -Does have pain more so in the MCP joint -Finger can bend but will get stuck and can't unbend  Insomnia: -Works second shift -Sleeps about 4-5 hours -Has trouble falling asleep but can usually stay asleep without issue -Is working on sleep hygiene items like using black out curtains and ambient noise -Does not take any sleep aids   Health Maintenance: -Blood work due   Outpatient Encounter Medications as of 10/20/2022  Medication Sig   Continuous Blood Gluc Sensor (DEXCOM G6 SENSOR) MISC SMARTSIG:Topical Every 10 Days   Continuous Blood Gluc Transmit (DEXCOM G6 TRANSMITTER) MISC USE TO MONITOR BLOOD SUGAR. REPLACE EVERY 3 MONTHS   Insulin Disposable Pump (OMNIPOD 5 G6  PODS, GEN 5,) MISC Inject into the skin.   lisinopril (ZESTRIL) 20 MG tablet Take 0.5 tablets (10 mg total) by mouth daily.   NOVOLOG 100 UNIT/ML injection SMARTSIG:0-100 Unit(s) SUB-Q Daily   No facility-administered encounter medications on file as of 10/20/2022.    Past Medical History:  Diagnosis Date   Diabetes mellitus    Type 1 diabetes mellitus (HCC)    Diagnosed at 41 yo with DKA / ICU admission    Past Surgical History:  Procedure Laterality Date   CHOLECYSTECTOMY N/A 11/03/2014   Procedure: Laparoscopic cholecystectomy ;  Surgeon: Lattie Haw, MD;  Location: [facility] ORS;  Service: General;  Laterality: N/A;   ESOPHAGOGASTRODUODENOSCOPY Left 11/02/2014   Procedure: ESOPHAGOGASTRODUODENOSCOPY (EGD);  Surgeon: Wallace Cullens, MD;  Location: [facility] ENDOSCOPY;  Service: Endoscopy;  Laterality: Left;    Family History  Problem Relation Age of Onset   Sickle cell anemia Father    Aneurysm Mother        Cerebral     Social History   Socioeconomic History   Marital status: Married    Spouse name: Not on file   Number of children: Not on file   Years of education: Not on file   Highest education level: Not on file  Occupational History   Not on file  Tobacco Use   Smoking status: Never   Smokeless tobacco: Never  Substance and Sexual Activity   Alcohol use: No   Drug use: Yes  Types: Marijuana    Comment: Marijuana every other day    Sexual activity: Yes  Other Topics Concern   Not on file  Social History Narrative   Still active, gets around without cane or walker. Lives at home with wife, no children. Unemployed. Never smoker, no alcohol.    Social Determinants of Health   Financial Resource Strain: Not on file  Food Insecurity: Not on file  Transportation Needs: Not on file  Physical Activity: Not on file  Stress: Not on file  Social Connections: Not on file  Intimate Partner Violence: Not on file    Review of Systems  All other systems reviewed and are  negative.       Objective    There were no vitals taken for this visit.  Physical Exam Constitutional:      Appearance: Normal appearance.  HENT:     Head: Normocephalic and atraumatic.     Mouth/Throat:     Mouth: Mucous membranes are moist.     Comments: Mild PND present Eyes:     Conjunctiva/sclera: Conjunctivae normal.  Cardiovascular:     Rate and Rhythm: Normal rate and regular rhythm.  Pulmonary:     Effort: Pulmonary effort is normal.     Breath sounds: Normal breath sounds.  Musculoskeletal:     Right lower leg: No edema.     Left lower leg: No edema.  Skin:    General: Skin is warm and dry.     Comments: Wound from dog bite wrapped  Neurological:     General: No focal deficit present.     Mental Status: He is alert. Mental status is at baseline.  Psychiatric:        Mood and Affect: Mood normal.        Behavior: Behavior normal.         Assessment & Plan:   1. Hospital discharge follow-up/Type 1 diabetes mellitus with hyperglycemia [facility]): Hospital discharge summary from 10/06/2022, labs and imaging reviewed.  Patient has an appointment with his endocrinologist later this week.  Thinking he may have had a viral illness that exacerbated DKA.  2. Lesion of liver greater than 1 cm in diameter: Multiple liver lesions seen on ultrasound and CT of abdomen with recommendations for MRI with and without contrast of the abdomen in 6 to 12 months.  Will order this test now to schedule in November.  - MR Abdomen W Wo Contrast; Future  3. Dog bite, subsequent encounter: Treated with Augmentin, still taking currently.  Patient states wounds are improving and are covered today.  Patient will follow-up next week for suture removal.   No follow-ups on file.   Margarita Mail, DO

## 2022-10-20 ENCOUNTER — Ambulatory Visit: Payer: BC Managed Care – PPO | Admitting: Internal Medicine

## 2022-10-20 ENCOUNTER — Encounter: Payer: Self-pay | Admitting: Internal Medicine

## 2022-10-20 VITALS — BP 134/88 | HR 96 | Temp 98.2°F | Resp 18 | Ht 72.0 in | Wt 163.0 lb

## 2022-10-20 DIAGNOSIS — W540XXD Bitten by dog, subsequent encounter: Secondary | ICD-10-CM

## 2022-10-20 DIAGNOSIS — Z4802 Encounter for removal of sutures: Secondary | ICD-10-CM

## 2022-10-20 DIAGNOSIS — M778 Other enthesopathies, not elsewhere classified: Secondary | ICD-10-CM

## 2022-10-20 DIAGNOSIS — M65331 Trigger finger, right middle finger: Secondary | ICD-10-CM | POA: Diagnosis not present

## 2022-10-20 MED ORDER — NAPROXEN 500 MG PO TABS
500.0000 mg | ORAL_TABLET | Freq: Two times a day (BID) | ORAL | 0 refills | Status: AC
Start: 2022-10-20 — End: 2022-10-30

## 2022-10-24 DIAGNOSIS — M79641 Pain in right hand: Secondary | ICD-10-CM | POA: Diagnosis not present

## 2022-10-24 DIAGNOSIS — M79642 Pain in left hand: Secondary | ICD-10-CM | POA: Diagnosis not present

## 2022-11-03 DIAGNOSIS — M79642 Pain in left hand: Secondary | ICD-10-CM | POA: Diagnosis not present

## 2022-11-03 DIAGNOSIS — M79641 Pain in right hand: Secondary | ICD-10-CM | POA: Diagnosis not present

## 2022-11-11 DIAGNOSIS — Z9641 Presence of insulin pump (external) (internal): Secondary | ICD-10-CM | POA: Diagnosis not present

## 2022-11-11 DIAGNOSIS — E103293 Type 1 diabetes mellitus with mild nonproliferative diabetic retinopathy without macular edema, bilateral: Secondary | ICD-10-CM | POA: Diagnosis not present

## 2022-11-11 DIAGNOSIS — E1065 Type 1 diabetes mellitus with hyperglycemia: Secondary | ICD-10-CM | POA: Diagnosis not present

## 2023-02-12 DIAGNOSIS — E103293 Type 1 diabetes mellitus with mild nonproliferative diabetic retinopathy without macular edema, bilateral: Secondary | ICD-10-CM | POA: Diagnosis not present

## 2023-02-19 DIAGNOSIS — Z9641 Presence of insulin pump (external) (internal): Secondary | ICD-10-CM | POA: Diagnosis not present

## 2023-02-19 DIAGNOSIS — E1065 Type 1 diabetes mellitus with hyperglycemia: Secondary | ICD-10-CM | POA: Diagnosis not present

## 2023-02-19 DIAGNOSIS — E103293 Type 1 diabetes mellitus with mild nonproliferative diabetic retinopathy without macular edema, bilateral: Secondary | ICD-10-CM | POA: Diagnosis not present

## 2023-04-17 ENCOUNTER — Ambulatory Visit: Admission: RE | Admit: 2023-04-17 | Payer: BC Managed Care – PPO | Source: Ambulatory Visit

## 2023-04-18 ENCOUNTER — Inpatient Hospital Stay: Admission: RE | Admit: 2023-04-18 | Payer: BC Managed Care – PPO | Source: Ambulatory Visit

## 2023-04-21 NOTE — Progress Notes (Unsigned)
Established Patient Office Visit  Subjective    Patient ID: Richard Bray, male    DOB: 12-16-1980  Age: 42 y.o. MRN: [id number]  CC:  No chief complaint on file.   HPI Richard Bray presents to follow up on chronic medical conditions.   Diabetes, Type 1: -Following with Endocrinology at [facility], last seen 11/11/22 -Has Omnipod 5 insulin pump since August  -Last A1c 9/24 8.8% -Medications: basal insulin 24 units in 24 hours, bolus about 15 g/unit, Lisinopril 20 mg  -Patient is compliant with the above medications and reports no side effects.  -Checking BG at home: has Dexcom -Eye exam: UTD 12/23 -Microalbumin: UTD 9/24 -Statin: No, last LDL 96 2/23 -PNA vaccine: prevnar 23 in 2013 -Denies symptoms of hypoglycemia, polyuria, polydipsia, numbness extremities, foot ulcers/trauma.   Right Trigger Finger: -Going on the last 6 months or so - located on his right third digit -Patient is right handed, works with drills with some repetitive motions -Does have pain more so in the MCP joint -Finger can bend but will get stuck and can't unbend  Insomnia: -Works second shift -Sleeps about 4-5 hours -Has trouble falling asleep but can usually stay asleep without issue -Is working on sleep hygiene items like using black out curtains and ambient noise -Does not take any sleep aids   Health Maintenance: -Blood work UTD   Outpatient Encounter Medications as of 04/22/2023  Medication Sig   Continuous Blood Gluc Sensor (DEXCOM G6 SENSOR) MISC SMARTSIG:Topical Every 10 Days   Continuous Blood Gluc Transmit (DEXCOM G6 TRANSMITTER) MISC USE TO MONITOR BLOOD SUGAR. REPLACE EVERY 3 MONTHS   Insulin Disposable Pump (OMNIPOD 5 G6 PODS, GEN 5,) MISC Inject into the skin.   lisinopril (ZESTRIL) 20 MG tablet Take 0.5 tablets (10 mg total) by mouth daily.   NOVOLOG 100 UNIT/ML injection SMARTSIG:0-100 Unit(s) SUB-Q Daily   No facility-administered encounter medications on file as of 04/22/2023.     Past Medical History:  Diagnosis Date   Diabetes mellitus    Type 1 diabetes mellitus (HCC)    Diagnosed at 42 yo with DKA / ICU admission    Past Surgical History:  Procedure Laterality Date   CHOLECYSTECTOMY N/A 11/03/2014   Procedure: Laparoscopic cholecystectomy ;  Surgeon: Lattie Haw, MD;  Location: [facility] ORS;  Service: General;  Laterality: N/A;   ESOPHAGOGASTRODUODENOSCOPY Left 11/02/2014   Procedure: ESOPHAGOGASTRODUODENOSCOPY (EGD);  Surgeon: Wallace Cullens, MD;  Location: [facility] ENDOSCOPY;  Service: Endoscopy;  Laterality: Left;    Family History  Problem Relation Age of Onset   Sickle cell anemia Father    Aneurysm Mother        Cerebral     Social History   Socioeconomic History   Marital status: Married    Spouse name: Not on file   Number of children: Not on file   Years of education: Not on file   Highest education level: Not on file  Occupational History   Not on file  Tobacco Use   Smoking status: Never   Smokeless tobacco: Never  Substance and Sexual Activity   Alcohol use: No   Drug use: Yes    Types: Marijuana    Comment: Marijuana every other day    Sexual activity: Yes  Other Topics Concern   Not on file  Social History Narrative   Still active, gets around without cane or walker. Lives at home with wife, no children. Unemployed. Never smoker, no alcohol.    Social  Determinants of Health   Financial Resource Strain: Not on file  Food Insecurity: Not on file  Transportation Needs: Not on file  Physical Activity: Not on file  Stress: Not on file  Social Connections: Not on file  Intimate Partner Violence: Not on file    Review of Systems  All other systems reviewed and are negative.       Objective    There were no vitals taken for this visit.  Physical Exam Constitutional:      Appearance: Normal appearance.  HENT:     Head: Normocephalic and atraumatic.     Mouth/Throat:     Mouth: Mucous membranes are moist.      Comments: Mild PND present Eyes:     Extraocular Movements: Extraocular movements intact.     Conjunctiva/sclera: Conjunctivae normal.     Pupils: Pupils are equal, round, and reactive to light.  Cardiovascular:     Rate and Rhythm: Normal rate and regular rhythm.  Pulmonary:     Effort: Pulmonary effort is normal.     Breath sounds: Normal breath sounds.  Musculoskeletal:        General: Tenderness present.     Right lower leg: No edema.     Left lower leg: No edema.     Comments: Right trigger finger present, moderate  Skin:    General: Skin is warm and dry.  Neurological:     General: No focal deficit present.     Mental Status: He is alert. Mental status is at baseline.  Psychiatric:        Mood and Affect: Mood normal.        Behavior: Behavior normal.         Assessment & Plan:   1. Type 1 diabetes mellitus with hyperglycemia (HCC)/Lipid screening: Follows with Endocrinology at [facility], note and labs from 07/22/22 reviewed. Will check lipid panel for this year.   - CBC w/Diff/Platelet - Lipid Profile  2. Elevated carbon dioxide level: Smokes occasionally, wife smokes. Does have a family history of sickle cell anemia in his father. Will obtain CBC to assess hemoglobin today.   - CBC w/Diff/Platelet  3. Trigger middle finger of right hand: Mild to moderate in severity but does cause him some pain. Conservative management discussed, also discussed potential for trigger finger release. Information printed for the patient.   4. Other insomnia: Discussed sleep hygiene tactics. He works second shift which is the primary cause of his insomnia. Recommend starting with over the counter melatonin and rapid eye movement before bed. Follow up if symptoms progress.    No follow-ups on file.   Margarita Mail, DO

## 2023-04-22 ENCOUNTER — Ambulatory Visit (INDEPENDENT_AMBULATORY_CARE_PROVIDER_SITE_OTHER): Payer: BC Managed Care – PPO | Admitting: Internal Medicine

## 2023-04-22 ENCOUNTER — Other Ambulatory Visit: Payer: Self-pay

## 2023-04-22 ENCOUNTER — Encounter: Payer: Self-pay | Admitting: Internal Medicine

## 2023-04-22 VITALS — BP 124/68 | HR 87 | Temp 98.6°F | Resp 16 | Ht 72.0 in | Wt 168.1 lb

## 2023-04-22 DIAGNOSIS — M65331 Trigger finger, right middle finger: Secondary | ICD-10-CM | POA: Diagnosis not present

## 2023-04-22 DIAGNOSIS — K769 Liver disease, unspecified: Secondary | ICD-10-CM | POA: Diagnosis not present

## 2023-04-22 DIAGNOSIS — M65332 Trigger finger, left middle finger: Secondary | ICD-10-CM

## 2023-04-22 DIAGNOSIS — E1065 Type 1 diabetes mellitus with hyperglycemia: Secondary | ICD-10-CM

## 2023-06-14 ENCOUNTER — Ambulatory Visit: Admission: RE | Admit: 2023-06-14 | Payer: BC Managed Care – PPO | Source: Ambulatory Visit

## 2023-06-29 ENCOUNTER — Emergency Department
Admission: EM | Admit: 2023-06-29 | Discharge: 2023-06-29 | Disposition: A | Discharge status: Home / Self Care | Payer: BC Managed Care – PPO | Attending: Emergency Medicine | Admitting: Emergency Medicine

## 2023-06-29 ENCOUNTER — Encounter: Payer: Self-pay | Admitting: Emergency Medicine

## 2023-06-29 ENCOUNTER — Emergency Department: Payer: BC Managed Care – PPO

## 2023-06-29 ENCOUNTER — Other Ambulatory Visit: Payer: Self-pay

## 2023-06-29 ENCOUNTER — Ambulatory Visit: Payer: Self-pay

## 2023-06-29 DIAGNOSIS — K5900 Constipation, unspecified: Secondary | ICD-10-CM | POA: Insufficient documentation

## 2023-06-29 DIAGNOSIS — E109 Type 1 diabetes mellitus without complications: Secondary | ICD-10-CM | POA: Insufficient documentation

## 2023-06-29 DIAGNOSIS — R1084 Generalized abdominal pain: Secondary | ICD-10-CM | POA: Diagnosis present

## 2023-06-29 NOTE — ED Triage Notes (Signed)
 Pt in via POV, reports ongoing generalized abdominal pain without a normal BM x 3 weeks.  Was recently seen in ER and prescribed many medications, reports using all of those over the weekend but still without any BM and continued pain.  Patient now reports nausea as well.  Ambulatory to triage, NAD noted at this time.

## 2023-06-29 NOTE — ED Provider Notes (Signed)
 [facility] Provider Note   Event Date/Time   First MD Initiated Contact with Patient 06/29/23 1539     (approximate) History  Abdominal Pain and Constipation  HPI Richard Bray is a 43 y.o. male with a past medical history of type 1 diabetes who presents complaining of constipation over the last 3 weeks.  Patient states he was seen in the emergency department recently and prescribed docusate, simethicone, and MiraLAX but they have not been working for his constipation.  Patient states that he has also been feeling dehydrated over this time.  Patient Dors is nausea without vomiting and associated generalized abdominal pain ROS: Patient currently denies any vision changes, tinnitus, difficulty speaking, facial droop, sore throat, chest pain, shortness of breath, vomiting/diarrhea, dysuria, or weakness/numbness/paresthesias in any extremity   Physical Exam  Triage Vital Signs: ED Triage Vitals  Encounter Vitals Group     BP 06/29/23 1433 108/73     Systolic BP Percentile --      Diastolic BP Percentile --      Pulse Rate 06/29/23 1433 72     Resp 06/29/23 1433 18     Temp 06/29/23 1433 98 F (36.7 C)     Temp Source 06/29/23 1433 Oral     SpO2 06/29/23 1433 99 %     Weight 06/29/23 1445 170 lb (77.1 kg)     Height 06/29/23 1445 6' (1.829 m)     Head Circumference --      Peak Flow --      Pain Score 06/29/23 1445 5     Pain Loc --      Pain Education --      Exclude from Growth Chart --    Most recent vital signs: Vitals:   06/29/23 1433  BP: 108/73  Pulse: 72  Resp: 18  Temp: 98 F (36.7 C)  SpO2: 99%   General: Awake, oriented x4. CV:  Good peripheral perfusion.  Resp:  Normal effort.  Abd:  No distention.  Generalized tenderness to palpation Other:  Well-developed, well-nourished middle-aged African-American male resting comfortably in no acute distress ED Results / Procedures / Treatments  Labs (all labs ordered are listed, but only  abnormal results are displayed) Labs Reviewed  LIPASE, BLOOD  COMPREHENSIVE METABOLIC PANEL  CBC   RADIOLOGY ED MD interpretation: Single view abdominal x-ray independently interpreted and shows nonobstructive bowel gas pattern with moderate to large volume of colonic stool -Agree with radiology assessment Official radiology report(s): DG Abdomen 1 View Result Date: 06/29/2023 CLINICAL DATA:  Constipation.  Abdominal pain. EXAM: ABDOMEN - 1 VIEW COMPARISON:  None Available. FINDINGS: Nonobstructive bowel-gas pattern. Moderate-to-large volume of colonic stool. No evidence of free air. Surgical clips in the right upper quadrant. No radio-opaque calculi or other significant radiographic abnormality are seen. IMPRESSION: Nonobstructive bowel-gas pattern. Moderate-to-large volume of colonic stool. Electronically Signed   By: Mannie Seek M.D.   On: 06/29/2023 16:50   PROCEDURES: Critical Care performed: No Procedures MEDICATIONS ORDERED IN ED: Medications - No data to display IMPRESSION / MDM / ASSESSMENT AND PLAN / ED COURSE  I reviewed the triage vital signs and the nursing notes.                             The patient is on the cardiac monitor to evaluate for evidence of arrhythmia and/or significant heart rate changes. Patient's presentation is most consistent with acute presentation with  potential threat to life or bodily function. Patient's history and exam most consistent with constipation as an etiology for their pain.  Patient's symptoms not typical for other emergent causes of abdominal pain such as, but not limited to, appendicitis, abdominal aortic aneurysm, pancreatitis, SBO, mesenteric ischemia, serious intra-abdominal bacterial illness.  Patient without red flags concerning for cancer as a constipation etiology.  Rx: Miralax  Disposition:  Patient will be discharged with strict return precautions and follow up with primary MD within 24-48 hours for further  evaluation. Patient understands that this still may have an early presentation of an emergent medical condition such as appendicitis that will require a recheck.   FINAL CLINICAL IMPRESSION(S) / ED DIAGNOSES   Final diagnoses:  Generalized abdominal pain  Constipation, unspecified constipation type   Rx / DC Orders   ED Discharge Orders     None      Note:  This document was prepared using Dragon voice recognition software and may include unintentional dictation errors.   Charleen Conn, MD 06/29/23 [phone]

## 2023-06-29 NOTE — ED Notes (Signed)
 See triage notes. Patient c/o constipation and abdominal pain. Patient stated his last good bowel movement was around 1/26. Patient stated they have tried suppositories, stool softeners, and miralax without relief.

## 2023-06-29 NOTE — Telephone Encounter (Signed)
 LVM to get the patient to set them up with an appt.

## 2023-06-29 NOTE — Discharge Instructions (Signed)
 Please use MiraLAX one half capful (half a pack) every hour until your first bowel movement.  Please do not take any MiraLAX after this for at least 24 hours.  You may use one half capful twice a day of MiraLAX in order to have 1 solid well-formed bowel movement per day.  You may increase or decrease this dosage as needed to obtain this 1 well-formed bowel movement.  Please make sure that you are drinking at least 8 ounces of water every hour during this initial bowel regimen.

## 2023-06-29 NOTE — Telephone Encounter (Signed)
  Chief Complaint: no BM x 3 weeks  Symptoms: abd pain radiates to the back, distension, flatulence, burping up fecal odor smelling burps,  Frequency: 3 weeks Pertinent Negatives: Patient denies vomiting Disposition: [x] ED /[] Urgent Care (no appt availability in office) / [] Appointment(In office/virtual)/ []  [facility]/ [] Home Care/ [] Refused Recommended Disposition /[] [facility]/ []  Follow-up with PCP Additional Notes:  Reason for Disposition  [1] Constant abdominal pain AND [2] present > 2 hours  Answer Assessment - Initial Assessment Questions 1. STOOL PATTERN OR FREQUENCY: "How often do you have a bowel movement (BM)?"  (Normal range: 3 times a day to every 3 days)  "When was your last BM?"       Last BM 3 weeks ago  2. STRAINING: "Do you have to strain to have a BM?"      Yes nothing coming out.  3. RECTAL PAIN: "Does your rectum hurt when the stool comes out?" If Yes, ask: "Do you have hemorrhoids? How bad is the pain?"  (Scale 1-10; or mild, moderate, severe)     *No Answer* 4. STOOL COMPOSITION: "Are the stools hard?"      No stool  5. BLOOD ON STOOLS: "Has there been any blood on the toilet tissue or on the surface of the BM?" If Yes, ask: "When was the last time?"     *No Answer* 6. CHRONIC CONSTIPATION: "Is this a new problem for you?"  If No, ask: "How long have you had this problem?" (days, weeks, months)      *No Answer* 7. CHANGES IN DIET OR HYDRATION: "Have there been any recent changes in your diet?" "How much fluids are you drinking on a daily basis?"  "How much have you had to drink today?"     *No Answer* 8. MEDICINES: "Have you been taking any new medicines?" "Are you taking any narcotic pain medicines?" (e.g., Dilaudid, morphine , Percocet, Vicodin)     *No Answer* 9. LAXATIVES: "Have you been using any stool softeners, laxatives, or enemas?"  If Yes, ask "What, how often, and when was the last time?"     Stool softener sennoside, nausea,  Miralax 17 grams ,suppositiori 10. ACTIVITY:  "How much walking do you do every day?"  "Has your activity level decreased in the past week?"        *No Answer* 11. CAUSE: "What do you think is causing the constipation?"        Unsure  12. OTHER SYMPTOMS: "Do you have any other symptoms?" (e.g., abdomen pain, bloating, fever, vomiting)       Abd pain, bloating, gas, burping that smells BM-  Protocols used: Constipation-A-AH

## 2023-07-01 ENCOUNTER — Encounter: Payer: Self-pay | Admitting: Internal Medicine

## 2023-07-01 ENCOUNTER — Other Ambulatory Visit: Payer: Self-pay

## 2023-07-01 ENCOUNTER — Ambulatory Visit: Payer: BC Managed Care – PPO | Admitting: Internal Medicine

## 2023-07-01 VITALS — BP 130/80 | HR 91 | Temp 98.0°F | Resp 18 | Ht 72.0 in | Wt 170.1 lb

## 2023-07-01 DIAGNOSIS — K5904 Chronic idiopathic constipation: Secondary | ICD-10-CM

## 2023-07-01 NOTE — Progress Notes (Signed)
   Acute Office Visit  Subjective:     Patient ID: Richard Bray, male    DOB: 1980/12/24, 43 y.o.   MRN: [id number]  Chief Complaint  Patient presents with   Constipation    Seen in ER. Now having loose stools    Constipation Pertinent negatives include no abdominal pain, fever, nausea or vomiting.   Patient is in today for constipation. He had not had a BM for nearly 3 weeks and he was having abdominal bloating and pain. He went to Urgent Care, was told to take Miralax once a day which did not do anything. He was then seen in the ER on 06/29/23 for this same issue. Abdominal x-ray showing nonobstructive bowel gas pattern and moderate to large volume of colonic stool. He was told to take Miralax every hour, which he is doing and having bowel movements although they are loose. Abdominal pain and bloating better.   Review of Systems  Constitutional:  Negative for chills and fever.  Gastrointestinal:  Positive for constipation. Negative for abdominal pain, nausea and vomiting.        Objective:    BP 130/80 (Cuff Size: Large)   Pulse 91   Temp 98 F (36.7 C) (Oral)   Resp 18   Ht 6' (1.829 m)   Wt 170 lb 1.6 oz (77.2 kg)   SpO2 99%   BMI 23.07 kg/m    Physical Exam Constitutional:      Appearance: Normal appearance.  HENT:     Head: Normocephalic and atraumatic.  Eyes:     Conjunctiva/sclera: Conjunctivae normal.  Cardiovascular:     Rate and Rhythm: Normal rate and regular rhythm.  Pulmonary:     Effort: Pulmonary effort is normal.     Breath sounds: Normal breath sounds.  Abdominal:     General: Bowel sounds are normal. There is no distension.     Palpations: Abdomen is soft.     Tenderness: There is no abdominal tenderness. There is no guarding.  Skin:    General: Skin is warm and dry.  Neurological:     General: No focal deficit present.     Mental Status: He is alert. Mental status is at baseline.  Psychiatric:        Mood and Affect: Mood normal.         Behavior: Behavior normal.     No results found for any visits on 07/01/23.      Assessment & Plan:   1. Chronic idiopathic constipation (Primary): Discussed that he can titrate Miralax to how he is responding - recommend changing to 3 times a day for now. Can also increase hydration, fiber and can use prune juice and magnesium citrate as well. Info printed for the patient to review.   Return for already scheduled.  Margarita Mail, DO

## 2023-07-01 NOTE — Patient Instructions (Addendum)
-  Recommend Miralax 3 times a day but can titrate as you respond -Be sure to increase water intake to help push bowels and dietary fiber or fiber supplements (be sure to start low and increase slowly) -Can also try magnesium citrate, prune juice, etc.   Constipation, Adult Constipation is when a person has fewer than three bowel movements in a week, has difficulty having a bowel movement, or has stools (feces) that are dry, hard, or larger than normal. Constipation may be caused by an underlying condition. It may become worse with age if a person takes certain medicines and does not take in enough fluids. Follow these instructions at home: Eating and drinking  Eat foods that have a lot of fiber, such as beans, whole grains, and fresh fruits and vegetables. Limit foods that are low in fiber and high in fat and processed sugars, such as fried or sweet foods. These include french fries, hamburgers, cookies, candies, and soda. Drink enough fluid to keep your urine pale yellow. General instructions Exercise regularly or as told by your health care provider. Try to do 150 minutes of moderate exercise each week. Use the bathroom when you have the urge to go. Do not hold it in. Take over-the-counter and prescription medicines only as told by your health care provider. This includes any fiber supplements. During bowel movements: Practice deep breathing while relaxing the lower abdomen. Practice pelvic floor relaxation. Watch your condition for any changes. Let your health care provider know about them. Keep all follow-up visits as told by your health care provider. This is important. Contact a health care provider if: You have pain that gets worse. You have a fever. You do not have a bowel movement after 4 days. You vomit. You are not hungry or you lose weight. You are bleeding from the opening between the buttocks (anus). You have thin, pencil-like stools. Get help right away if: You have a  fever and your symptoms suddenly get worse. You leak stool or have blood in your stool. Your abdomen is bloated. You have severe pain in your abdomen. You feel dizzy or you faint. Summary Constipation is when a person has fewer than three bowel movements in a week, has difficulty having a bowel movement, or has stools (feces) that are dry, hard, or larger than normal. Eat foods that have a lot of fiber, such as beans, whole grains, and fresh fruits and vegetables. Drink enough fluid to keep your urine pale yellow. Take over-the-counter and prescription medicines only as told by your health care provider. This includes any fiber supplements. This information is not intended to replace advice given to you by your health care provider. Make sure you discuss any questions you have with your health care provider. Document Revised: 03/19/2022 Document Reviewed: 03/19/2022 Elsevier Patient Education  2024 [employer].

## 2023-07-08 ENCOUNTER — Telehealth: Payer: Self-pay | Admitting: Internal Medicine

## 2023-07-08 NOTE — Telephone Encounter (Signed)
Patient dropped off document FMLA, to be filled out by provider. Patient requested to send it back via Call Patient to pick up within 7-days. Document is located in providers white folder at front office.Please advise at Mobile [phone] (mobile). Pt was seen 2.12.2025

## 2023-07-08 NOTE — Telephone Encounter (Signed)
Can you fill out per last visit 2/12 due to ER visit

## 2023-07-21 NOTE — Telephone Encounter (Signed)
 Spoke with pt and he said someone called him on yesterday and the issue has been resolved. He already have an appointment for the 11th of March

## 2023-07-28 ENCOUNTER — Encounter: Payer: Self-pay | Admitting: Internal Medicine

## 2023-07-28 ENCOUNTER — Other Ambulatory Visit: Payer: Self-pay

## 2023-07-28 ENCOUNTER — Ambulatory Visit: Payer: BC Managed Care – PPO | Admitting: Internal Medicine

## 2023-07-28 VITALS — BP 118/74 | HR 88 | Resp 16 | Ht 72.0 in | Wt 168.0 lb

## 2023-07-28 DIAGNOSIS — Z23 Encounter for immunization: Secondary | ICD-10-CM

## 2023-07-28 DIAGNOSIS — M65332 Trigger finger, left middle finger: Secondary | ICD-10-CM | POA: Diagnosis not present

## 2023-07-28 DIAGNOSIS — E1065 Type 1 diabetes mellitus with hyperglycemia: Secondary | ICD-10-CM

## 2023-07-28 DIAGNOSIS — M65331 Trigger finger, right middle finger: Secondary | ICD-10-CM | POA: Diagnosis not present

## 2023-07-28 NOTE — Progress Notes (Signed)
 Established Patient Office Visit  Subjective    Patient ID: Richard Bray, male    DOB: 30-May-1980  Age: 43 y.o. MRN: [id number]  CC:  Chief Complaint  Patient presents with   Medical Management of Chronic Issues    HPI Richard Bray presents to follow-up on chronic medical conditions.  Diabetes, Type 1: -Following with Endocrinology at [facility], has an appointment next week.  -Has Omnipod 5 insulin pump  -Last A1c 1/25 8.6% -Medications: basal insulin 24 units in 24 hours, bolus about 15 g/unit, Lisinopril 20 mg  -Patient is compliant with the above medications and reports no side effects.  -Checking BG at home: has Dexcom -Eye exam: Due, patient will call to schedule -Microalbumin: UTD 9/24 -Statin: No -PNA vaccine: prevnar 23 in 2013, will administer Prevnar 20 today -Denies symptoms of hypoglycemia, polyuria, polydipsia, numbness extremities, foot ulcers/trauma.   Right Trigger Finger: -Going on t for about a year now- located on his right third digit but now on the left hand as well -Patient is right handed, works with drills with some repetitive motions -Does have pain more so in the MCP joint -Finger can bend but will get stuck and can't unbend -Referrals placed for Ortho previously but he still having the issue  Health Maintenance: -Blood work up-to-date   Outpatient Encounter Medications as of 07/28/2023  Medication Sig   lisinopril (ZESTRIL) 20 MG tablet Take 0.5 tablets (10 mg total) by mouth daily.   NOVOLOG 100 UNIT/ML injection SMARTSIG:0-100 Unit(s) SUB-Q Daily   Continuous Blood Gluc Sensor (DEXCOM G6 SENSOR) MISC SMARTSIG:Topical Every 10 Days (Patient not taking: Reported on 04/22/2023)   Continuous Blood Gluc Transmit (DEXCOM G6 TRANSMITTER) MISC USE TO MONITOR BLOOD SUGAR. REPLACE EVERY 3 MONTHS (Patient not taking: Reported on 04/22/2023)   Insulin Disposable Pump (OMNIPOD 5 G6 PODS, GEN 5,) MISC Inject into the skin. (Patient not taking: Reported on  04/22/2023)   No facility-administered encounter medications on file as of 07/28/2023.    Past Medical History:  Diagnosis Date   Diabetes mellitus    Type 1 diabetes mellitus (HCC)    Diagnosed at 43 yo with DKA / ICU admission    Past Surgical History:  Procedure Laterality Date   CHOLECYSTECTOMY N/A 11/03/2014   Procedure: Laparoscopic cholecystectomy ;  Surgeon: Lattie Haw, MD;  Location: [facility] ORS;  Service: General;  Laterality: N/A;   ESOPHAGOGASTRODUODENOSCOPY Left 11/02/2014   Procedure: ESOPHAGOGASTRODUODENOSCOPY (EGD);  Surgeon: Wallace Cullens, MD;  Location: [facility] ENDOSCOPY;  Service: Endoscopy;  Laterality: Left;    Family History  Problem Relation Age of Onset   Sickle cell anemia Father    Aneurysm Mother        Cerebral     Social History   Socioeconomic History   Marital status: Married    Spouse name: Not on file   Number of children: Not on file   Years of education: Not on file   Highest education level: Not on file  Occupational History   Not on file  Tobacco Use   Smoking status: Never   Smokeless tobacco: Never  Vaping Use   Vaping status: Not on file  Substance and Sexual Activity   Alcohol use: No   Drug use: Yes    Types: Marijuana    Comment: Marijuana every other day    Sexual activity: Yes  Other Topics Concern   Not on file  Social History Narrative   Still active, gets around without cane  or walker. Lives at home with wife, no children. Unemployed. Never smoker, no alcohol.    Social Drivers of Corporate investment banker Strain: Not on file  Food Insecurity: Not on file  Transportation Needs: Not on file  Physical Activity: Not on file  Stress: Not on file  Social Connections: Not on file  Intimate Partner Violence: Not on file    Review of Systems  All other systems reviewed and are negative.       Objective    BP 118/74 (Cuff Size: Large)   Pulse 88   Resp 16   Ht 6' (1.829 m)   Wt 168 lb (76.2 kg)   SpO2 99%    BMI 22.78 kg/m   Physical Exam Constitutional:      Appearance: Normal appearance.  HENT:     Head: Normocephalic and atraumatic.     Mouth/Throat:     Mouth: Mucous membranes are moist.     Pharynx: Oropharynx is clear.  Eyes:     Extraocular Movements: Extraocular movements intact.     Conjunctiva/sclera: Conjunctivae normal.     Pupils: Pupils are equal, round, and reactive to light.  Neck:     Comments: No thyromegaly Cardiovascular:     Rate and Rhythm: Normal rate and regular rhythm.  Pulmonary:     Effort: Pulmonary effort is normal.     Breath sounds: Normal breath sounds.  Musculoskeletal:     Cervical back: No tenderness.     Right lower leg: No edema.     Left lower leg: No edema.  Lymphadenopathy:     Cervical: No cervical adenopathy.  Skin:    General: Skin is warm and dry.  Neurological:     General: No focal deficit present.     Mental Status: He is alert. Mental status is at baseline.  Psychiatric:        Mood and Affect: Mood normal.        Behavior: Behavior normal.         Assessment & Plan:   Type 1 diabetes mellitus with hyperglycemia [facility]) Assessment & Plan: Following with endocrinology, has an appointment next week.  A1c improved in January, no changes made to insulin.   Trigger finger, left middle finger  Trigger middle finger of right hand  Vaccine for streptococcus pneumoniae and influenza -     Pneumococcal conjugate vaccine 20-valent   Discussing with referral coordinator getting the patient an appointment with Ortho to be assessed for bilateral trigger finger.  Return in about 6 months (around 01/28/2024) for can cancel June appointment and move to 6 months from now please.   Margarita Mail, DO

## 2023-07-28 NOTE — Assessment & Plan Note (Signed)
 Following with endocrinology, has an appointment next week.  A1c improved in January, no changes made to insulin.

## 2023-10-22 ENCOUNTER — Ambulatory Visit: Payer: Self-pay | Admitting: Internal Medicine

## 2024-01-28 ENCOUNTER — Encounter: Payer: Self-pay | Admitting: Internal Medicine

## 2024-01-28 ENCOUNTER — Ambulatory Visit: Admitting: Internal Medicine

## 2024-01-28 ENCOUNTER — Other Ambulatory Visit: Payer: Self-pay

## 2024-01-28 VITALS — BP 112/82 | HR 78 | Temp 97.7°F | Resp 16 | Ht 72.0 in | Wt 164.7 lb

## 2024-01-28 DIAGNOSIS — E1065 Type 1 diabetes mellitus with hyperglycemia: Secondary | ICD-10-CM | POA: Diagnosis not present

## 2024-01-28 DIAGNOSIS — Z23 Encounter for immunization: Secondary | ICD-10-CM

## 2024-01-28 DIAGNOSIS — L600 Ingrowing nail: Secondary | ICD-10-CM | POA: Diagnosis not present

## 2024-01-28 LAB — POCT GLYCOSYLATED HEMOGLOBIN (HGB A1C): Hemoglobin A1C: 8.2 % — AB (ref 4.0–5.6)

## 2024-01-28 NOTE — Progress Notes (Signed)
 Established Patient Office Visit  Subjective    Patient ID: Richard Bray, male    DOB: 07/05/1980  Age: 44 y.o. MRN: [id number]  CC:  Chief Complaint  Patient presents with   Medical Management of Chronic Issues    6 month recheck    HPI Richard Bray presents to follow-up on chronic medical conditions.  Discussed the use of AI scribe software for clinical note transcription with the patient, who gave verbal consent to proceed.  History of Present Illness Richard Bray is a 43 year old male with type 1 diabetes who presents for management of blood sugar levels and recent A1c results.  He experiences difficulty managing blood sugar levels, with an A1c of 9.2 in July, up from 8.6 in January. His endocrinologist adjusted his insulin  pump settings, decreasing the bolus. He often experiences hypoglycemia after work at 11 PM, despite these adjustments. Blood sugar drops significantly during his physically demanding job, from 170 to 60 within an hour and a half of starting work. Blood sugar tends to be high by lunchtime at 8:30 PM, despite minimal food or drink intake.  He is confused about insulin  dosing, particularly regarding blood sugar levels versus carbohydrate intake. He is instructed to bolus for carbohydrates without considering current blood sugar levels, which he finds confusing. His work schedule from 4 PM to 12:30 AM and limited sleep of about four hours per day impact his diabetes management.  He uses a continuous glucose monitor (Dexcom) and an insulin  pump (Omnipod) for diabetes management. His average blood sugar levels have been decreasing, with a recent A1c of 8.2. He expresses concern about the impact of his work schedule on maintaining stable blood sugar levels.   Diabetes, Type 1: -Following with Endocrinology at [facility], had an appointment in July -Has Omnipod 5 insulin  pump  -Last A1c 7/25 9.2% -Medications: basal insulin  24 units in 24 hours, bolus about 15  g/unit, Lisinopril  20 mg  -Patient is compliant with the above medications and reports no side effects.  -Checking BG at home: has Dexcom -Eye exam: Due, patient will call to schedule -Microalbumin: Due at follow up -Statin: No -PNA vaccine: UTD -Denies symptoms of hypoglycemia, polyuria, polydipsia, numbness extremities, foot ulcers/trauma.   Health Maintenance: -Blood work up-to-date   Outpatient Encounter Medications as of 01/28/2024  Medication Sig   lisinopril  (ZESTRIL ) 20 MG tablet Take 0.5 tablets (10 mg total) by mouth daily.   NOVOLOG  100 UNIT/ML injection SMARTSIG:0-100 Unit(s) SUB-Q Daily   Continuous Blood Gluc Sensor (DEXCOM G6 SENSOR) MISC SMARTSIG:Topical Every 10 Days (Patient not taking: Reported on 04/22/2023)   Continuous Blood Gluc Transmit (DEXCOM G6 TRANSMITTER) MISC USE TO MONITOR BLOOD SUGAR. REPLACE EVERY 3 MONTHS (Patient not taking: Reported on 04/22/2023)   Insulin  Disposable Pump (OMNIPOD 5 G6 PODS, GEN 5,) MISC Inject into the skin. (Patient not taking: Reported on 04/22/2023)   No facility-administered encounter medications on file as of 01/28/2024.    Past Medical History:  Diagnosis Date   Diabetes mellitus    Type 1 diabetes mellitus (HCC)    Diagnosed at 43 yo with DKA / ICU admission    Past Surgical History:  Procedure Laterality Date   CHOLECYSTECTOMY N/A 11/03/2014   Procedure: Laparoscopic cholecystectomy ;  Surgeon: Charlie FORBES Fell, MD;  Location: [facility] ORS;  Service: General;  Laterality: N/A;   ESOPHAGOGASTRODUODENOSCOPY Left 11/02/2014   Procedure: ESOPHAGOGASTRODUODENOSCOPY (EGD);  Surgeon: Deward CINDERELLA Piedmont, MD;  Location: [facility] ENDOSCOPY;  Service: Endoscopy;  Laterality: Left;    Family History  Problem Relation Age of Onset   Sickle cell anemia Father    Aneurysm Mother        Cerebral     Social History   Socioeconomic History   Marital status: Married    Spouse name: Not on file   Number of children: Not on file   Years of  education: Not on file   Highest education level: Not on file  Occupational History   Not on file  Tobacco Use   Smoking status: Never   Smokeless tobacco: Never  Vaping Use   Vaping status: Not on file  Substance and Sexual Activity   Alcohol use: No   Drug use: Yes    Types: Marijuana    Comment: Marijuana every other day    Sexual activity: Yes  Other Topics Concern   Not on file  Social History Narrative   Still active, gets around without cane or walker. Lives at home with wife, no children. Unemployed. Never smoker, no alcohol.    Social Drivers of Corporate investment banker Strain: Not on file  Food Insecurity: Not on file  Transportation Needs: Not on file  Physical Activity: Not on file  Stress: Not on file  Social Connections: Not on file  Intimate Partner Violence: Not on file    Review of Systems  All other systems reviewed and are negative.       Objective    There were no vitals taken for this visit.  Physical Exam Constitutional:      Appearance: Normal appearance.  HENT:     Head: Normocephalic and atraumatic.  Eyes:     Conjunctiva/sclera: Conjunctivae normal.  Cardiovascular:     Rate and Rhythm: Normal rate and regular rhythm.  Pulmonary:     Effort: Pulmonary effort is normal.     Breath sounds: Normal breath sounds.  Skin:    General: Skin is warm and dry.  Neurological:     General: No focal deficit present.     Mental Status: He is alert. Mental status is at baseline.  Psychiatric:        Mood and Affect: Mood normal.        Behavior: Behavior normal.         Assessment & Plan:   Assessment & Plan Type 1 diabetes mellitus with hyperglycemia A1c was 9.2% in July and 8.2% today. Challenges with insulin  pump and carbohydrate counting causing glycemic variability. Irregular work schedule and physical activity complicate glucose management. - Educated on insulin  pump use and carbohydrate counting. - Advised insulin  coverage  for all meals unless immediately burning off energy. - Recommended protein-based snacks at work. - Suggested more regular work schedule for better diabetes management. - Encouraged monitoring blood glucose levels 1-2 hours postprandial.  Ingrowing nails of bilateral great toes Ingrowing nails require podiatric evaluation to prevent complications due to diabetes. - Referred to podiatry for evaluation and management.  - Flu vaccine trivalent PF, 6mos and older(Flulaval,Afluria,Fluarix,Fluzone) - POCT HgB A1C - Ambulatory referral to Podiatry   Return in about 6 months (around 07/27/2024).   Sharyle Fischer, DO

## 2024-02-03 ENCOUNTER — Ambulatory Visit: Payer: Self-pay | Admitting: Podiatry

## 2024-02-24 ENCOUNTER — Ambulatory Visit (INDEPENDENT_AMBULATORY_CARE_PROVIDER_SITE_OTHER): Admitting: Podiatry

## 2024-02-24 VITALS — Ht 72.0 in | Wt 164.7 lb

## 2024-02-24 DIAGNOSIS — L84 Corns and callosities: Secondary | ICD-10-CM

## 2024-02-24 DIAGNOSIS — B353 Tinea pedis: Secondary | ICD-10-CM

## 2024-02-24 DIAGNOSIS — L600 Ingrowing nail: Secondary | ICD-10-CM

## 2024-02-24 DIAGNOSIS — M2041 Other hammer toe(s) (acquired), right foot: Secondary | ICD-10-CM | POA: Diagnosis not present

## 2024-02-24 DIAGNOSIS — M7742 Metatarsalgia, left foot: Secondary | ICD-10-CM

## 2024-02-24 DIAGNOSIS — M7741 Metatarsalgia, right foot: Secondary | ICD-10-CM | POA: Diagnosis not present

## 2024-02-24 MED ORDER — KETOCONAZOLE 2 % EX CREA
1.0000 | TOPICAL_CREAM | Freq: Every day | CUTANEOUS | 2 refills | Status: AC
Start: 2024-02-24 — End: ?

## 2024-02-24 NOTE — Progress Notes (Signed)
  Subjective:  Patient ID: Richard Bray, male    DOB: [birth year],  MRN: [id number]  Chief Complaint  Patient presents with   Ingrown Toenail    Rm 2 Patient is here for ingrown toe nail of the rt and lt hallux ( pain on lateral/medial sides). Patient is concerned with callus between right 4th and 5th toes. Pt is diabetic.    43 y.o. male presents with the above complaint. History confirmed with patient.  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  Submetatarsal 1 and 5 callus bilateral  Assessment:   1. Metatarsalgia of both feet   2. Callus of foot   3. Hammertoe of right foot   4. Ingrowing right great toenail   5. Ingrowing left great toenail   6. Tinea pedis of both feet      Plan:  Patient was evaluated and treated and all questions answered.  Discussed the etiology and treatment options for tinea pedis.  Discussed topical and oral treatment.  Recommended topical treatment with 2% ketoconazole cream.  This was sent to the patient's pharmacy.  Also discussed appropriate foot hygiene, use of antifungal spray such as Tinactin in shoes, as well as cleaning her foot surfaces such as showers and bathroom floors with bleach.  Discussed the prominent calluses and metatarsalgia.  Discussed offloading with custom molded foot orthoses.  He will be scheduled for fitting for these.  We discussed treatment of his ingrowing nails.  Discussed partial permanent matricectomy.  We discussed long-term that correction with surgical matricectomy or phenol matricectomy would offer some permanent relief.   Currently his A1c is 8.2% and I recommend he target this further under 8% and then we can proceed with matricectomy.  He will let me know after his next check how it is doing.  We also discussed the callus on the lateral fourth toe.  Recommend offloading silicone pads discussed with treatment options that exist if they do not approve it also we need his A1c below 8% before proceeding with any surgical correction.  Return if symptoms worsen or fail to improve.

## 2024-03-18 ENCOUNTER — Ambulatory Visit (INDEPENDENT_AMBULATORY_CARE_PROVIDER_SITE_OTHER): Admitting: Podiatry

## 2024-03-18 DIAGNOSIS — M7741 Metatarsalgia, right foot: Secondary | ICD-10-CM | POA: Diagnosis not present

## 2024-03-18 DIAGNOSIS — M2041 Other hammer toe(s) (acquired), right foot: Secondary | ICD-10-CM

## 2024-03-18 DIAGNOSIS — M7742 Metatarsalgia, left foot: Secondary | ICD-10-CM | POA: Diagnosis not present

## 2024-03-18 NOTE — Progress Notes (Signed)
 Patient of Dr. Silva comes in today to be measured for custom orthotics. A foam box is used to mold his feet. He weighs 170 lbs and wears a men's size 12 regular athletic shoe. Advised that we will call him when his orthotics come in. He will call our office with any questions or concerns.

## 2024-05-05 ENCOUNTER — Telehealth: Payer: Self-pay

## 2024-05-05 NOTE — Telephone Encounter (Signed)
 Patient orthotics are in and will be in [facility] ready to be sent to [place]. Balance is $542.00

## 2024-08-01 ENCOUNTER — Ambulatory Visit: Admitting: Internal Medicine
